# Patient Record
Sex: Male | Born: 1960 | Race: White | Hispanic: No | Marital: Married
Health system: Midwestern US, Community
[De-identification: ages and names within clinical notes are randomized; demographics above are authoritative.]

## PROBLEM LIST (undated history)

## (undated) DIAGNOSIS — IMO0002 Reserved for concepts with insufficient information to code with codable children: Secondary | ICD-10-CM

## (undated) DIAGNOSIS — E669 Obesity, unspecified: Secondary | ICD-10-CM

## (undated) DIAGNOSIS — Z0000 Encounter for general adult medical examination without abnormal findings: Secondary | ICD-10-CM

## (undated) DIAGNOSIS — M25511 Pain in right shoulder: Secondary | ICD-10-CM

## (undated) DIAGNOSIS — Z125 Encounter for screening for malignant neoplasm of prostate: Secondary | ICD-10-CM

## (undated) DIAGNOSIS — G5601 Carpal tunnel syndrome, right upper limb: Secondary | ICD-10-CM

## (undated) DIAGNOSIS — E109 Type 1 diabetes mellitus without complications: Principal | ICD-10-CM

## (undated) DIAGNOSIS — M65312 Trigger thumb, left thumb: Secondary | ICD-10-CM

## (undated) DIAGNOSIS — IMO0001 Reserved for inherently not codable concepts without codable children: Secondary | ICD-10-CM

## (undated) DIAGNOSIS — N138 Other obstructive and reflux uropathy: Secondary | ICD-10-CM

## (undated) DIAGNOSIS — G5602 Carpal tunnel syndrome, left upper limb: Secondary | ICD-10-CM

## (undated) DIAGNOSIS — E119 Type 2 diabetes mellitus without complications: Principal | ICD-10-CM

## (undated) DIAGNOSIS — N401 Enlarged prostate with lower urinary tract symptoms: Secondary | ICD-10-CM

## (undated) DIAGNOSIS — Z794 Long term (current) use of insulin: Secondary | ICD-10-CM

## (undated) DIAGNOSIS — Z1211 Encounter for screening for malignant neoplasm of colon: Secondary | ICD-10-CM

## (undated) DIAGNOSIS — E7800 Pure hypercholesterolemia, unspecified: Secondary | ICD-10-CM

## (undated) DIAGNOSIS — N528 Other male erectile dysfunction: Secondary | ICD-10-CM

---

## 2007-12-27 LAB — CBC WITH AUTO DIFFERENTIAL
Basophils %: 0.3 % (ref 0.0–2.0)
Basophils Absolute: 0 10*3 (ref 0.0–0.2)
Eosinophils %: 2 % (ref 0.0–5.0)
Eosinophils Absolute: 0.2 10*3 (ref 0.0–0.6)
Granulocyte Absolute Count: 6.4 10*3 (ref 1.7–7.7)
Hematocrit: 45.6 % (ref 40.5–52.5)
Hemoglobin: 15.9 g/dL (ref 13.5–17.5)
Lymphocytes %: 17.7 % — ABNORMAL LOW (ref 25.0–40.0)
Lymphocytes Absolute: 1.6 10*3 (ref 1.0–5.1)
MCH: 34 pg (ref 26–34)
MCHC: 35 g/dL (ref 31–36)
MCV: 97.1 fl (ref 80–100)
MPV: 9.7 fl (ref 5.0–10.5)
Monocytes %: 7.7 % (ref 0.0–8.0)
Monocytes Absolute: 0.7 10*3 (ref 0.0–0.95)
Platelets: 190 10*3 (ref 135–450)
RBC: 4.69 10*6 (ref 4.2–5.9)
RDW: 12.6 % (ref 11.5–14.5)
Segs Relative: 72.3 % — ABNORMAL HIGH (ref 42.0–63.0)
WBC: 8.9 10*3 (ref 4.0–11.0)

## 2007-12-27 LAB — BASIC METABOLIC PANEL
BUN: 16 mg/dL (ref 7–18)
CO2: 28 meq/L (ref 21–32)
Calcium: 9.4 mg/dL (ref 8.3–10.6)
Chloride: 106 meq/L (ref 99–110)
Creatinine: 1 mg/dL (ref 0.9–1.3)
GFR Est, African/Amer: >60
GFR, Estimated: >60 (ref >60)
Glucose: 186 mg/dL — ABNORMAL HIGH (ref 70–99)
Potassium: 5 meq/L (ref 3.5–5.1)
Sodium: 142 meq/L (ref 136–145)

## 2007-12-27 LAB — HEPATIC FUNCTION PANEL
ALT: 26 U/L (ref 10–40)
AST: 34 U/L (ref 15–37)
Albumin: 4.4 g/dL (ref 3.4–5.0)
Alkaline Phosphatase: 65 U/L (ref 25–100)
Bilirubin, Direct: 0.24 mg/dL (ref 0.0–0.3)
Bilirubin, Indirect: 0.5 mg/dL (ref 0.0–1.0)
Total Bilirubin: 0.7 mg/dL (ref 0.0–1.0)
Total Protein: 6.8 g/dL (ref 6.4–8.2)

## 2007-12-27 LAB — LIPID PANEL
Cholesterol, Total: 176 mg/dl (ref <200)
HDL: 49 mg/dL (ref 40–60)
LDL Calculated: 114 mg/dl — ABNORMAL HIGH (ref <100)
Triglycerides: 70 mg/dl (ref <150)
VLDL Cholesterol Calculated: 14 mg/dl

## 2007-12-27 LAB — HEMOGLOBIN A1C
A1c: 8.2 — ABNORMAL HIGH (ref 4.0–6.0)
eAG: 188.6 mg/dl

## 2008-03-27 ENCOUNTER — Encounter

## 2008-04-23 LAB — COMPREHENSIVE METABOLIC PANEL
ALT: 28 U/L (ref 10–40)
AST: 23 U/L (ref 15–37)
Albumin/Globulin Ratio: 1.8 (ref 1.1–2.2)
Albumin: 4.4 g/dL (ref 3.4–5.0)
Alkaline Phosphatase: 66 U/L (ref 25–100)
BUN: 14 mg/dL (ref 7–18)
CO2: 28 meq/L (ref 21–32)
Calcium: 9.5 mg/dL (ref 8.3–10.6)
Chloride: 106 meq/L (ref 99–110)
Creatinine: 1 mg/dL (ref 0.9–1.3)
GFR Est, African/Amer: >60
GFR, Estimated: >60 (ref >60)
Glucose: 195 mg/dL — ABNORMAL HIGH (ref 70–99)
Potassium: 4.4 meq/L (ref 3.5–5.1)
Sodium: 140 meq/L (ref 136–145)
Total Bilirubin: 0.9 mg/dL (ref 0.0–1.0)
Total Protein: 6.8 g/dL (ref 6.4–8.2)

## 2008-04-23 LAB — LIPID PANEL
Cholesterol, Total: 190 mg/dl (ref <200)
HDL: 52 mg/dL (ref 40–60)
LDL Calculated: 123 mg/dl — ABNORMAL HIGH (ref <100)
Triglycerides: 70 mg/dl (ref <150)
VLDL Cholesterol Calculated: 14 mg/dl

## 2008-04-24 LAB — HEMOGLOBIN A1C
A1c: 8 — ABNORMAL HIGH (ref 4.0–6.0)
eAG: 182.9 mg/dl

## 2008-04-30 NOTE — Telephone Encounter (Signed)
Patient requesting medication.  Patient complains of a "head cold", no fever, cough.

## 2008-05-17 NOTE — Telephone Encounter (Signed)
Busy signal on home phone today. Last time to call was  6:05 today. Will try again tomorrow, and use work number also.

## 2008-05-19 NOTE — Telephone Encounter (Signed)
Spoke with pt today and made an appointment for next week to see Dr. Selena Batten for diabetic check up.

## 2008-05-27 NOTE — Progress Notes (Signed)
Subjective:      Patient ID: Jeremiah Guzman is a 48 y.o. male.    HPI Comments:       250.00BV DM (Diabetes Mellitus)  (primary encounter diagnosis)  Plan: Liraglutide (VICTOZA) 18 MG/3ML SOLN,         phentermine (ADIPEX-P) 37.5 MG capsule        Gained wt.  Comment: A1c was 8.0 not able to control blood sugar.            Review of Systems   Constitutional: Negative.    HENT: Negative for hearing loss, ear pain and sinus pressure.    Eyes: Negative.    Respiratory: Negative for apnea, choking and stridor.    Genitourinary: Negative for hematuria, discharge, penile swelling and penile pain.   Musculoskeletal: Negative for back pain.   Skin: Negative for color change and pallor.   Neurological: Negative for dizziness, tremors, seizures and syncope.   Hematological: Does not bruise/bleed easily.   Psychiatric/Behavioral: Negative for confusion, dysphoric mood and decreased concentration.       Objective:   Physical Exam   Constitutional: He appears well-developed and well-nourished.   HENT:   Head: Normocephalic and atraumatic.   Right Ear: External ear normal.   Left Ear: External ear normal.   Nose: Nose normal.   Mouth/Throat: Oropharynx is clear and moist.   Eyes: Conjunctivae are normal. Right eye exhibits no discharge. Left eye exhibits no discharge. No scleral icterus.   Neck: Normal range of motion. Neck supple. No JVD present. No tracheal deviation present. No thyromegaly present.   Cardiovascular: Normal rate, regular rhythm, normal heart sounds and intact distal pulses.  Exam reveals no gallop and no friction rub.    No murmur heard.  Pulmonary/Chest: Effort normal and breath sounds normal. No stridor. No respiratory distress. He has no wheezes. He has no rales. He exhibits no tenderness.   Abdominal: Soft. Bowel sounds are normal. He exhibits no distension and no mass. No tenderness. He has no rebound and no guarding.   Musculoskeletal: He exhibits no edema and no tenderness.   Lymphadenopathy:     He has no  cervical adenopathy.   Neurological: He has normal reflexes. No cranial nerve deficit. He exhibits normal muscle tone. Coordination normal.   Skin: Skin is warm and dry. No rash noted. No erythema. No pallor.   Psychiatric: He has a normal mood and affect. His behavior is normal. Judgment and thought content normal.       Assessment:      Diabetes.        Plan:      See above.

## 2008-06-28 NOTE — Telephone Encounter (Signed)
Patient walked in 06-28-2008 to be weighed and wanted a refill.   Patient weighed 245.8, he lost 5.2 pounds since 05-27-2008.

## 2008-09-23 NOTE — Progress Notes (Addendum)
Addended by: Emeril Stille on: 09/23/2008      Modules accepted: Orders

## 2008-09-23 NOTE — Progress Notes (Signed)
Subjective:      Patient ID: Jeremiah Guzman is a 48 y.o. male.    HPI Comments: Jeremiah Guzman is a 48 y.o. male who presents with vomited blood yesterday. Had symptoms of abdominal pain and vomiting. More than 6 to 12 oz of blood, fresh vomiting.. Symptom onset has been acute for a time period of 1 day(s). Severity is described as moderate to severe. Course of his symptoms over time is acute. Never had any Sx of GI system. No blood in stool..        Review of Systems   Constitutional: Negative.    HENT: Negative.  Negative for hearing loss, ear pain and sinus pressure.    Eyes: Negative.    Respiratory: Negative for apnea, choking and stridor.    Gastrointestinal: Negative.    Genitourinary: Negative for hematuria.   Musculoskeletal: Negative.  Negative for back pain.   Skin: Negative.  Negative for color change and pallor.   Neurological: Negative for dizziness, tremors, seizures and syncope.   Hematological: Does not bruise/bleed easily.   Psychiatric/Behavioral: Negative for confusion, dysphoric mood and decreased concentration.       Objective:   Physical Exam   Constitutional: He is oriented to person, place, and time. He appears well-nourished.   HENT:   Head: Normocephalic.   Neck: Neck supple.   Pulmonary/Chest: Effort normal.   Abdominal: Soft.   Musculoskeletal: He exhibits no edema.   Neurological: He is alert and oriented to person, place, and time.   Skin: Skin is warm.       Assessment:      Encounter Diagnoses   Name Primary?   ??? Obese    ??? DM (Diabetes Mellitus)    ??? Hematemesis Yes             Plan:      Referral to GI and blood test foe DM. Refill his adipex. byetta or victoza were not approved by insurance co.        Dry cough due to lisinopril. Had colonoscope in 3 years ago.

## 2008-09-24 LAB — HEMOGLOBIN A1C
A1c: 8.4 — ABNORMAL HIGH (ref 4.0–6.0)
eAG: 194.4 mg/dl

## 2008-09-24 LAB — COMPREHENSIVE METABOLIC PANEL
ALT: 30 U/L (ref 10–40)
AST: 23 U/L (ref 15–37)
Albumin/Globulin Ratio: 2 (ref 1.1–2.2)
Albumin: 4.7 g/dL (ref 3.4–5.0)
Alkaline Phosphatase: 61 U/L
BUN: 12 mg/dL (ref 7–18)
CO2: 26 meq/L (ref 21–32)
Calcium: 9.9 mg/dL (ref 8.3–10.6)
Chloride: 102 meq/L (ref 99–110)
Creatinine: 1 mg/dL (ref 0.9–1.3)
GFR Est, African/Amer: >60
GFR, Estimated: >60 (ref >60)
Glucose: 66 mg/dL — ABNORMAL LOW (ref 70–99)
Potassium: 3.9 meq/L (ref 3.5–5.1)
Sodium: 144 meq/L (ref 136–145)
Total Bilirubin: 0.5 mg/dL (ref 0.0–1.0)
Total Protein: 7 g/dL (ref 6.4–8.2)

## 2008-09-24 LAB — LIPID PANEL
Cholesterol, Total: 188 mg/dl (ref <200)
HDL: 61 mg/dL — ABNORMAL HIGH (ref 40–60)
LDL Calculated: 112 mg/dl — ABNORMAL HIGH (ref <100)
Triglycerides: 78 mg/dl (ref <150)
VLDL Cholesterol Calculated: 16 mg/dl

## 2009-02-03 LAB — POCT URINALYSIS DIPSTICK
Bilirubin, UA: NEGATIVE
Blood, UA POC: NEGATIVE
Glucose, UA POC: NEGATIVE
Ketones, UA: NEGATIVE
Nitrite, UA: NEGATIVE
Protein, UA POC: NEGATIVE
Spec Grav, UA: 1.02
Urobilinogen, UA: NEGATIVE
pH, UA: 5

## 2009-02-03 NOTE — Progress Notes (Signed)
Subjective:      Patient ID: Jeremiah Guzman is a 49 y.o. male.    HPI Comments: Jeremiah Guzman is a 49 y.o. 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)      Obesity (278.62M)         Date Noted: 03/27/2008      DM (Diabetes Mellitus) (250.00BV)         Date Noted: 03/27/2008      HYPERLIPIDEMIA (272.4S)         Date Noted: 03/27/2008      HBP (HIGH BLOOD PRESSURE) (401.9AP)         Date Noted: 03/27/2008      Current outpatient prescriptions:  simvastatin (ZOCOR) 40 MG tablet, Take 1 tablet by mouth daily., Disp: 30 tablet, Rfl: 6  Insulin Pen Needle (B-D ULTRAFINE III SHORT PEN) 31G X 8 MM MISC, by Does not apply route 2 times daily., Disp: 100 each, Rfl: 12  phentermine (ADIPEX-P) 37.5 MG capsule, Take 1 capsule by mouth every morning., Disp: 30 capsule, Rfl: 0  insulin glargine (LANTUS SOLOSTAR) 100 UNIT/ML injection, INJECT 60 UNITS DAILY, Disp: 15 mL, Rfl: 6  metformin (GLUCOPHAGE) 500 MG tablet, Take 500 mg by mouth 2 times daily (with meals)., Disp: , Rfl:   lisinopril (PRINIVIL;ZESTRIL) 10 MG tablet, Take 1 tablet by mouth daily., Disp: 30 tablet, Rfl: 6      Allergies: Review of patient's allergies indicates no known allergies.  Past Medical History:    OBESITY                                         03/27/2008     HYPERLIPIDEMIA                                  03/27/2008     HBP (HIGH BLOOD PRESSURE)                       03/27/2008     DM W/O COMPLICATION TYPE II                                   CANCER OF SKIN                                              Past Surgical History:    TONSILLECTOMY                                               Review of patient's family history indicates:    Cancer                         Mother                      Comment: colon    Cancer  Father                      Comment: skin      Tobacco Use: Never           Alcohol Use: Yes           4.0 oz/week       8 Drinks containing 0.5 oz of  alcohol per week        Diabetes  He presents for his follow-up diabetic visit. He has type 2 diabetes mellitus. The initial diagnosis of diabetes was made 12 years ago. His disease course has been fluctuating. Pertinent negatives for diabetes include no blurred vision, no chest pain, no foot paresthesias, no foot ulcerations, no polyphagia and no weight loss. Symptoms are stable. There are no hypoglycemic associated symptoms. Pertinent negatives for hypoglycemia include no confusion, dizziness, headaches, pallor, seizures or tremors. There are no diabetic complications. Risk factors for coronary artery disease include diabetes mellitus, dyslipidemia, hypertension and male sex.       Review of Systems   Constitutional: Negative for weight loss.   HENT: Negative.  Negative for hearing loss, ear pain and sinus pressure.    Eyes: Negative.  Negative for blurred vision and visual disturbance.   Respiratory: Negative.  Negative for apnea, choking and stridor.    Cardiovascular: Negative.  Negative for chest pain and palpitations.   Gastrointestinal: Negative.  Negative for diarrhea.   Genitourinary: Negative.  Negative for hematuria, penile swelling and penile pain.   Musculoskeletal: Negative.  Negative for back pain.   Skin: Negative.  Negative for color change and pallor.   Neurological: Negative for dizziness, tremors, seizures, syncope, numbness and headaches.   Hematological: Negative for polyphagia. Does not bruise/bleed easily.   Psychiatric/Behavioral: Negative for confusion, dysphoric mood and decreased concentration.       Objective:   Physical Exam   Constitutional: He appears well-developed and well-nourished.   HENT:   Mouth/Throat: No oropharyngeal exudate.   Eyes: Conjunctivae and extraocular motions are normal. Pupils are equal, round, and reactive to light. No scleral icterus.   Neck: Normal range of motion. No thyromegaly present.   Cardiovascular: Normal rate, regular rhythm and intact distal pulses.     Pulmonary/Chest: Effort normal. He has no wheezes. He exhibits no tenderness.   Abdominal: Soft. He exhibits no mass. No tenderness.   Musculoskeletal: Normal range of motion. He exhibits no edema.   Lymphadenopathy:     He has no cervical adenopathy.   Neurological: He is alert. No cranial nerve deficit.   Skin: Skin is warm.   Psychiatric: His behavior is normal.       Assessment:      Encounter Diagnoses   Name Primary?   . DM (diabetes mellitus) Yes   . Obesity              Plan:      See above.

## 2009-02-03 NOTE — Progress Notes (Signed)
Assessed patient's tobacco status?  Yes   If Smoker - Cessation materials given? No    Microfilament or Tuning Fork placed on counter? Yes    Shoes and socks removed? Yes    Diabetic retinal exam done? Yes   If yes, where? Dr Jacolyn Reedy             When? November / 2010    In chart? No    Medication list reviewed Yes   Is patient on Aspirin? Yes    BP taken with correct size cuff? Yes   Repeated if > 130/80 No     Microalbumin performed if applicable?  Yes , patient was not able to give a specimen.

## 2009-02-04 LAB — COMPREHENSIVE METABOLIC PANEL
ALT: 29 U/L (ref 10–40)
AST: 21 U/L (ref 15–37)
Albumin/Globulin Ratio: 2 (ref 1.1–2.2)
Albumin: 4.7 g/dL (ref 3.4–5.0)
Alkaline Phosphatase: 70 U/L (ref 45–129)
BUN: 13 mg/dL (ref 7–18)
CO2: 29 meq/L (ref 21–32)
Calcium: 9.7 mg/dL (ref 8.3–10.6)
Chloride: 102 meq/L (ref 99–110)
Creatinine: 1 mg/dL (ref 0.9–1.3)
GFR Est, African/Amer: >60
GFR, Estimated: >60 (ref >60)
Glucose: 135 mg/dL — ABNORMAL HIGH (ref 70–99)
Potassium: 3.9 meq/L (ref 3.5–5.1)
Sodium: 138 meq/L (ref 136–145)
Total Bilirubin: 0.8 mg/dL (ref 0.0–1.0)
Total Protein: 7.1 g/dL (ref 6.4–8.2)

## 2009-02-04 LAB — LIPID PANEL
Cholesterol, Total: 200 mg/dl — ABNORMAL HIGH (ref <200)
HDL: 53 mg/dL (ref 40–60)
LDL Calculated: 128 mg/dl — ABNORMAL HIGH (ref <100)
Triglycerides: 92 mg/dl (ref <150)
VLDL Cholesterol Calculated: 18 mg/dl

## 2009-02-04 LAB — CBC
Hematocrit: 45.5 % (ref 40.5–52.5)
Hemoglobin: 15.7 g/dL (ref 13.5–17.5)
MCH: 33.2 pg (ref 26–34)
MCHC: 34.4 g/dL (ref 31–36)
MCV: 96.3 fl (ref 80–100)
MPV: 10.9 fl — ABNORMAL HIGH (ref 5.0–10.5)
Platelets: 199 10*3 (ref 135–450)
RBC: 4.72 10*6 (ref 4.2–5.9)
RDW: 11.9 % (ref 11.5–14.5)
WBC: 10.4 10*3 (ref 4.0–11.0)

## 2009-02-04 LAB — HEMOGLOBIN A1C
A1c: 8.8 — ABNORMAL HIGH (ref 4.0–6.0)
eAG: 205.9 mg/dl

## 2009-03-14 NOTE — Progress Notes (Signed)
Patient requesting a refill on Adipex. Last weight was 250 on 02-03-2009.Current weight is 244.4.  Rosanne Ashing last filled on 02-03-2009 and the time before that was in September.

## 2009-07-29 LAB — COMPREHENSIVE METABOLIC PANEL
ALT: 45 U/L — ABNORMAL HIGH (ref 10–40)
AST: 25 U/L (ref 15–37)
Albumin/Globulin Ratio: 1.7 (ref 1.1–2.2)
Albumin: 4.4 gm/dl (ref 3.4–5.0)
Alkaline Phosphatase: 68 U/L (ref 45–129)
BUN: 15 mg/dl (ref 7–18)
CO2: 25 mEq/L (ref 21–32)
Calcium: 9.6 mg/dl (ref 8.3–10.6)
Chloride: 103 mEq/L (ref 99–110)
Creatinine: 1 mg/dl (ref 0.9–1.3)
GFR Est, African/Amer: >60
GFR, Estimated: >60 (ref >60)
Glucose: 219 mg/dl — ABNORMAL HIGH (ref 70–99)
Potassium: 5.1 mEq/L (ref 3.5–5.1)
Sodium: 140 mEq/L (ref 136–145)
Total Bilirubin: 0.7 mg/dl (ref 0.0–1.0)
Total Protein: 7 gm/dl (ref 6.4–8.2)

## 2009-07-29 LAB — LIPID PANEL
Cholesterol, Total: 189 mg/dl (ref <200)
HDL: 62 mg/dl — ABNORMAL HIGH (ref 40–60)
LDL Calculated: 108 mg/dl — ABNORMAL HIGH (ref <100)
Triglycerides: 100 mg/dl (ref <150)
VLDL Cholesterol Calculated: 20 mg/dl

## 2009-07-29 LAB — CBC
Hematocrit: 44.2 % (ref 40.5–52.5)
Hemoglobin: 15.2 gm/dl (ref 13.5–17.5)
MCH: 33.6 pg (ref 26–34)
MCHC: 34.5 gm/dl (ref 31–36)
MCV: 97.4 fl (ref 80–100)
MPV: 10.2 fl (ref 5.0–10.5)
Platelets: 182 10*3 (ref 135–450)
RBC: 4.54 10*6 (ref 4.2–5.9)
RDW: 12.3 % (ref 11.5–14.5)
WBC: 9.5 10*3 (ref 4.0–11.0)

## 2009-07-29 NOTE — Progress Notes (Signed)
Subjective:      Patient ID: Jeremiah Guzman is a 49 y.o. male.    Chief Complaint   Patient presents with   ??? Neck Pain     Patient states that he started Thursday morning with discomfort/ pain on the Left side of his Neck. Patient states that he has no problems swallowing.     Nursing note/chief complaint reviewed and discussed with patient.    Vital signs, Allergies. Past Medical, Social  and Surgical Histories as well as Family History reviewed and discussed with patient.    Neck Pain   This is a new problem. The current episode started yesterday (Had congestion and runny nose as well as maxilllary sinus pressure earlier in the week. Had just returned from 1 week vacation in [place].). The problem occurs constantly. The problem has been unchanged. The pain is associated with nothing. The pain is present in the left side and anterior neck. The quality of the pain is described as aching. The pain is at a severity of 8/10. The pain is severe. The symptoms are aggravated by swallowing. The pain is same all the time. Associated symptoms include a fever (felt feverish earlier in the week), headaches and pain with swallowing. Pertinent negatives include no chest pain, leg pain, numbness, paresis, tingling, trouble swallowing, weakness or weight loss. He has tried NSAIDs for the symptoms. The treatment provided no relief.       Review of Systems   Constitutional: Positive for fever (felt feverish earlier in the week). Negative for weight loss, diaphoresis and appetite change.   HENT: Positive for congestion (earlier in the week), rhinorrhea (earlier in the week), neck pain and sinus pressure (earlier in the week). Negative for hearing loss, ear pain, nosebleeds, sore throat, trouble swallowing, neck stiffness, voice change, tinnitus and ear discharge.    Eyes: Negative.    Respiratory: Negative for cough, chest tightness, shortness of breath and wheezing.    Cardiovascular: Negative for chest pain, palpitations and leg  swelling.   Gastrointestinal: Negative for nausea, vomiting and abdominal pain.   Skin: Negative for rash.   Neurological: Positive for headaches. Negative for dizziness, tingling, weakness, light-headedness and numbness.   Hematological: Negative for adenopathy (unsure).   Psychiatric/Behavioral: Negative.        Objective:   Physical Exam   [nursing notereviewed.  Constitutional: He is oriented to person, place, and time. Vital signs are normal. He appears well-developed and well-nourished. He appears cachectic. He is active. He does not appear ill. He appears distressed (mildly).   HENT:   Head: Normocephalic and atraumatic.   Right Ear: Tympanic membrane is not injected and not erythematous. A middle ear effusion is present.   Left Ear: Tympanic membrane is not injected and not erythematous. A middle ear effusion (L>R) is present.   Nose: Mucosal edema present. No rhinorrhea. Right sinus exhibits no maxillary sinus tenderness and no frontal sinus tenderness. Left sinus exhibits no maxillary sinus tenderness and no frontal sinus tenderness.   Mouth/Throat: Uvula is midline and mucous membranes are normal. Normal dentition. No uvula swelling. Oropharyngeal exudate (PND) present. No posterior oropharyngeal edema or posterior oropharyngeal erythema.   Eyes: Conjunctivae and EOM are normal. Pupils are equal, round, and reactive to light. Right eye exhibits no discharge. Left eye exhibits no discharge.   Neck: Trachea normal, normal range of motion, full passive range of motion without pain and phonation normal. Neck supple. Normal carotid pulses, no hepatojugular reflux and no JVD present. Muscular tenderness (  just behind swollen left anterior cervical lymph gland) present. No tracheal tenderness and no spinous process tenderness present. Carotid bruit is not present. No rigidity. No tracheal deviation, no edema, no erythema and normal range of motion present. No mass and no thyromegaly present.   Cardiovascular:  Normal rate, regular rhythm, normal heart sounds and intact distal pulses.    No murmur heard.  Pulmonary/Chest: Effort normal and breath sounds normal. No stridor. No respiratory distress.   Lymphadenopathy:     He has cervical adenopathy.        Right cervical: No superficial cervical adenopathy present.       Left cervical: Superficial cervical and deep cervical adenopathy present.   Neurological: He is alert and oriented to person, place, and time.   Skin: Skin is warm and dry. No rash noted.   Psychiatric: He has a normal mood and affect.       Assessment:      1. Cervical lymphadenitis  CBC, predniSONE (DELTASONE) 10 MG tablet    left-sided only     2. Anterior neck pain  hydrocodone-acetaminophen (CO-GESIC) 5-500 MG per tablet   3. Obesity  LIPID PANEL, HEMOGLOBIN A1C, COMPREHENSIVE METABOLIC PANEL, CBC, phentermine (ADIPEX-P) 37.5 MG capsule   4. ETD (eustachian tube dysfunction)  mometasone (NASONEX) 50 MCG/ACT nasal spray             Plan:      Prednisone as directed. No NSAID's until prednisone completed  Nasonex as needed for ear/nasal congestion (Rx given)  Vicodin as needed for severe breakthrough pain  Start Adipex for appetite control- suggested starting [email or web address] (explained at length how the website works)  F/U in 1 month for weight loss check

## 2009-07-29 NOTE — Progress Notes (Deleted)
Subjective:      Patient ID: Jeremiah Guzman is a 49 y.o. male.    HPI    Review of Systems    Objective:   Physical Exam    Assessment:      ***      Plan:      ***

## 2009-07-30 LAB — HEMOGLOBIN A1C
A1c: 8.4 — ABNORMAL HIGH (ref 4.0–6.0)
eAG: 194.4 mg/dl

## 2009-08-01 NOTE — Patient Instructions (Addendum)
Prednisone as directed. No NSAID's until prednisone completed  Nasonex as needed for ear/nasal congestion (Rx given)  Vicodin as needed for severe breakthrough pain  Start Adipex for appetite control- suggested starting [email or web address]  F/U in 1 month for weight loss check    Chronic Lymphadenitis   (Lymph Node Infection; Lymph Gland Infection; Lymph Node Inflammation; Lymph Gland Inflammation)     Definition   Chronic lymphadenitis is the inflammation of a lymph node. The inflammation can last for a prolonged period of time. Lymph nodes are part of the immune system. This system fights and prevents infections. The lymph node's job is to filter out unwanted substances, such as bacteria and viruses, and help eliminate them from the body.   Lymph nodes occur in clusters in the neck, arm pits, and groin. Chronic lymphadenitis may affect one node, several nodes in one area (regional), or nodes in many areas of the body (general).   The sooner chronic lymphadenitis is treated, the more favorable the outcome. If you suspect you have this condition, contact your doctor immediately.     Swollen Lymph Nodes        2011 [employer]   Causes   Lymph nodes normally swell when fighting off an infection. In cases of more serious infection, the swelling may be prolonged. Lymphadenitis is usually caused by an infection that has spread to the lymph nodes from a skin, ear, nose, or eye infection. Other causes of lymphadenitis include the following:   ?? Infection with streptococcal or staphylococcal bacteria   ?? Bacterial sore throat   ?? Tonsillitis   ?? Dental infection   ?? HIV infection   ?? Tuberculosis   ?? Nontuberculosis mycobacterial disease   ?? Tularemia (rabbit fever)   ?? Plague   ?? Cat scratch disease   ?? Primary or secondary syphilis   ?? Lymphogranuloma venereum   ?? Chancroid   ?? Genital herpes   ?? Mononucleosis   ?? Cytomegalovirus infection   ?? Toxoplasmosis   ?? Brucellosis   ?? Disseminated histoplasmosis     Lymph node inflammation may also be caused by circulating cancer cells.   Risk Factors   A risk factor is something that increases your chances of getting a disease or condition.   The following factors increase your chances of developing chronic lymphadenitis. If you have any of these risk factors, tell your doctor:   ?? The conditions listed under Causes   ?? Close contact with someone who has one of the conditions listed above   ?? Age: 20 or younger; chronic lymphadenitis commonly occurs in children.   ?? Contact with animals, specifically cats, rats, or cows   Symptoms   If you experience any of these symptoms, do not assume it is due to chronic lymphadenitis. These symptoms may be caused by other, less serious health conditions. If you experience any one of them, see your physician.   Symptoms include:   ?? Swollen, painful, tender, or hard lymph nodes   ?? The skin over a node is red and warm to the touch   ?? Fever with the following symptoms:   ?? Chills   ?? Loss of appetite   ?? Heavy perspiration   ?? Rapid pulse   ?? General weakness   ?? Difficulty swallowing   ?? Difficulty breathing   ?? Neck stiffness   Diagnosis   Your doctor will ask about your symptoms and medical history, and perform a physical exam.  Tests may include the following:   ?? White blood cell (WBC) countWBCs help fight infection, so levels will be high if you have an infection.   ?? Blood culturetesting of a sample of blood to look for bacteria or fungus   ?? Biopsy of the lymph noderemoval of a sample of lymph node tissue for testing   ?? Ultrasounda test that uses sound waves to examine your lymph nodes   Treatment   Treatment of chronic lymphadenitis depends on the cause. Talk with your doctor about the best treatment plan for you. Treatment options include the following:   Medications   ?? Antibiotics to control an infection   ?? Anti-inflammatory medicationsto help reduce inflammation and swelling; aspirin may be recommended for adults.   ?? Note  : Aspirin is not recommended for children or teens with a current or recent viral infection. This is because of the risk of Reye's syndrome . Ask your doctor which other medicines are safe for your child.   Supportive Care   Hot, moist compresses on the lymph nodes can help relieve pain.   Surgery   Surgery may be necessary to drain abscesses (pockets of pus), if they occur.   Prevention   To help reduce your chances of getting chronic lymphadenitis, take the following steps:   ?? Seek prompt treatment of bacterial and viral infections. Contact your doctor at the first signs of infection (fever, chills, redness).   ?? Take steps to prevent getting an infection:   ?? Practice good hygiene practices, such as washing your hands regularly.   ?? Avoid close contact with people who are sick.   ?? Eat a healthful diet, one that is low in saturated fat and rich in whole grains, fruits, and vegetables.     Last Reviewed: September 2010 B. Antoine Primas, MD   Updated: 11/11/2008

## 2009-08-19 NOTE — Telephone Encounter (Signed)
Jeremiah Guzman canceled his appointment today.   He is requesting a refill on Accuchek compact plus test strips. He test BID.  He is also asking for a new meter.  [facility].

## 2009-08-25 NOTE — Progress Notes (Signed)
Assessed patient's tobacco status?  No   If Smoker - Cessation materials given? No    Microfilament or Tuning Fork placed on counter? Yes    Shoes and socks removed? Yes    Diabetic retinal exam done? Yes   If yes, where? Dr Jacolyn Reedy          When? October / 2010    In chart? Yes    Medication list reviewed Yes   Is patient on Aspirin? No    BP taken with correct size cuff? Yes   Repeated if > 130/80 No     Microalbumin performed if applicable?  Yes, will obtain today

## 2009-08-25 NOTE — Progress Notes (Signed)
Subjective:      Patient ID: Jeremiah Guzman is a 49 y.o. male.    Diabetes  He presents for his follow-up diabetic visit. He has type 2 diabetes mellitus. The initial diagnosis of diabetes was made 12 years ago. His disease course has been stable. Hypoglycemia symptoms include headaches and nervousness/anxiousness. Pertinent negatives for hypoglycemia include no confusion, dizziness, pallor, seizures or tremors. Pertinent negatives for diabetes include no blurred vision. There are no diabetic complications. Risk factors for coronary artery disease include diabetes mellitus and hypertension. Current diabetic treatment includes insulin injections. He is compliant with treatment all of the time. He is following a low fat/cholesterol and generally healthy diet. He participates in exercise intermittently. His breakfast blood glucose is taken between 7-8 am. His breakfast blood glucose range is generally 140-180 mg/dl. An ACE inhibitor/angiotensin II receptor blocker is being taken.       Review of Systems   Constitutional: Negative.    HENT: Negative for hearing loss, ear pain and sinus pressure.    Eyes: Negative.  Negative for blurred vision.   Respiratory: Negative.  Negative for apnea, choking and stridor.    Cardiovascular: Negative.    Gastrointestinal: Negative.    Genitourinary: Negative.  Negative for hematuria and penile swelling.   Musculoskeletal: Negative.  Negative for back pain.   Skin: Negative for color change and pallor.   Neurological: Positive for headaches. Negative for dizziness, tremors, seizures and syncope.   Hematological: Does not bruise/bleed easily.   Psychiatric/Behavioral: Positive for sleep disturbance. Negative for confusion, dysphoric mood and decreased concentration. The patient is nervous/anxious.        Objective:   Physical Exam   Constitutional: He appears well-developed and well-nourished.   HENT:   Mouth/Throat: No oropharyngeal exudate.   Eyes: Conjunctivae and EOM are normal. Pupils  are equal, round, and reactive to light. No scleral icterus.   Neck: Normal range of motion. No thyromegaly present.   Cardiovascular: Normal rate, regular rhythm and intact distal pulses.    Pulmonary/Chest: Effort normal. He has no wheezes. He exhibits no tenderness.   Abdominal: Soft. He exhibits no mass. No tenderness.   Musculoskeletal: Normal range of motion. He exhibits no edema.   Lymphadenopathy:     He has no cervical adenopathy.   Neurological: He is alert. No cranial nerve deficit.   Skin: Skin is warm.   Psychiatric: His behavior is normal.       Assessment:      Encounter Diagnoses   Name Primary?   ??? Obesity    ??? DM (diabetes mellitus) Yes     s      Plan:      I have recommended the following steps for improving diabetic care and outcome to him: home glucose monitoring emphasized, use and side effects of insulin is taught and annual eye examinations at Ophthalmology discussed. A followup visit will be scheduled in the near future to review and reinforce the importance of careful diabetic control to improve long term outcomes.          Encounter Diagnoses   Name Primary?   ??? Obesity    ??? DM (diabetes mellitus) Yes

## 2009-09-01 NOTE — Telephone Encounter (Signed)
[facility] states that Janumet 50-1000 #60 BID needs PA with insurance.  [employer] [phone]  ID [id number]  [facility] [phone].  Do PA?

## 2009-09-01 NOTE — Telephone Encounter (Signed)
Try with PA.

## 2009-09-01 NOTE — Telephone Encounter (Signed)
Called [employer] [facility] changed medication to 50-500 #60 BID.

## 2009-09-01 NOTE — Telephone Encounter (Signed)
He needs janumet 50/500 bid. He is on insuline already.

## 2009-09-01 NOTE — Telephone Encounter (Signed)
It shows patient taking Janumet 50-500 #60 BID in the chart.   Please advise which one patient is to be on.    Janumet 50-500 #60 BID was sent to [facility] on 08-25-2009.  Janumet 50-1000 #60 BID was discontinued 08-25-2009.

## 2009-12-05 NOTE — Telephone Encounter (Signed)
Jeremiah Guzman is requesting a refill on Lantus sent to [facility].

## 2010-01-27 MED ORDER — PHENTERMINE HCL 37.5 MG PO CAPS
37.5 MG | ORAL_CAPSULE | Freq: Every morning | ORAL | Status: DC
Start: 2010-01-27 — End: 2010-02-24

## 2010-01-27 NOTE — Progress Notes (Signed)
Subjective:      Patient ID: Jeremiah Guzman is a 50 y.o. male.    HPI Comments: Jeremiah Guzman is a 50 y.o. male who presents with rt side facial numbness and tingling after had cold Sx 10 days ago.. Symptom onset has been acute for a time period of 10 day(s). Severity is described as moderate. Course of his symptoms over time is acute. No sign of weakness of facial muscles..        Review of Systems   Constitutional: Negative.    HENT: Negative for hearing loss, ear pain and sinus pressure.    Eyes: Negative.    Respiratory: Negative for apnea, choking and stridor.    Genitourinary: Negative for hematuria, discharge, penile swelling and penile pain.   Musculoskeletal: Negative for back pain.   Skin: Negative for color change and pallor.   Neurological: Positive for numbness. Negative for dizziness, tremors, seizures and syncope.        [Rt side temporal area of face is numb and tingling.  Hematological: Does not bruise/bleed easily.   Psychiatric/Behavioral: Negative for confusion, dysphoric mood and decreased concentration.       Objective:   Physical Exam   Constitutional: He appears well-developed and well-nourished.   HENT:   Mouth/Throat: No oropharyngeal exudate.   Eyes: Conjunctivae and EOM are normal. Pupils are equal, round, and reactive to light. No scleral icterus.   Neck: Normal range of motion. No thyromegaly present.   Cardiovascular: Normal rate, regular rhythm and intact distal pulses.    Pulmonary/Chest: Effort normal. He has no wheezes. He exhibits no tenderness.   Abdominal: Soft. He exhibits no mass. No tenderness.   Musculoskeletal: Normal range of motion. He exhibits no edema.   Lymphadenopathy:     He has no cervical adenopathy.   Neurological: He is alert. No cranial nerve deficit.   Skin: Skin is warm.        Facial skin is clear but feels numb, comes and goes. Intermitently. Nothing irritated.    Psychiatric: His behavior is normal.       Assessment:      Encounter Diagnoses   Name Primary?   ???  Trigeminal neuralgia Yes   ??? DM (diabetes mellitus)    ??? Obesity            Plan:      Observation for neuralgia. Refill med for overweight.

## 2010-01-27 NOTE — Patient Instructions (Signed)
Watch diet and exercise for weight and diabetes.

## 2010-01-30 NOTE — Telephone Encounter (Signed)
[facility] states JANUMET 50/500 #60 BID needs a PA.  [phone].

## 2010-02-02 NOTE — Telephone Encounter (Signed)
[facility], they are faxing over a form.

## 2010-02-08 NOTE — Telephone Encounter (Signed)
Faxed form to [facility].

## 2010-02-09 NOTE — Telephone Encounter (Signed)
Janumet has been approved.

## 2010-02-21 MED ORDER — LISINOPRIL 10 MG PO TABS
10 MG | ORAL_TABLET | Freq: Every day | ORAL | Status: DC
Start: 2010-02-21 — End: 2010-02-21

## 2010-02-21 MED ORDER — SIMVASTATIN 40 MG PO TABS
40 MG | ORAL_TABLET | Freq: Every day | ORAL | Status: DC
Start: 2010-02-21 — End: 2011-03-23

## 2010-02-21 MED ORDER — LISINOPRIL 10 MG PO TABS
10 MG | ORAL_TABLET | Freq: Every day | ORAL | Status: DC
Start: 2010-02-21 — End: 2011-03-23

## 2010-02-21 MED ORDER — SIMVASTATIN 40 MG PO TABS
40 MG | ORAL_TABLET | Freq: Every day | ORAL | Status: DC
Start: 2010-02-21 — End: 2010-02-21

## 2010-02-21 NOTE — Telephone Encounter (Signed)
Jeremiah Guzman is requesting a refill on Lisinopril and Simvastatin sent to [facility].

## 2010-02-21 NOTE — Telephone Encounter (Signed)
Jeremiah Guzman states he will be out of his Lisinopril and Simvastatin and doesn't think [facility] will be here in time.   He is requesting for a refill sent to [facility].

## 2010-02-24 MED ORDER — METHYLPREDNISOLONE 4 MG PO TBPK
4 MG | PACK | ORAL | Status: AC
Start: 2010-02-24 — End: 2010-03-02

## 2010-02-24 MED ORDER — PHENTERMINE HCL 37.5 MG PO CAPS
37.5 MG | ORAL_CAPSULE | Freq: Every morning | ORAL | Status: DC
Start: 2010-02-24 — End: 2010-07-17

## 2010-02-24 NOTE — Progress Notes (Signed)
Subjective:      Patient ID: Jeremiah Guzman is a 50 y.o. male.    Chief Complaint   Patient presents with   ??? Facial Pain     and swelling, has had numbness in the past, right temple, ear and  jaw pain x over a year      Nursing note/chief complaint reviewed and discussed with patient. OV notes from 07-29-09 and 01-27-10 reviewed and discussed with pt.    Vital signs, Allergies. Past Medical, Social  and Surgical Histories as well as Family History reviewed and discussed with patient.    BP 138/78   Pulse 76   Temp(Src) 98.3 ??F (36.8 ??C) (Oral)   Resp 18   Ht 5\' 11"  (1.803 m)   Wt 254 lb (115.214 kg)   BMI 35.43 kg/m2    Wt Readings from Last 3 Encounters:   02/24/10 254 lb (115.214 kg)   01/27/10 258 lb 6.4 oz (117.209 kg)   08/25/09 247 lb 12.8 oz (112.401 kg)         HPI  Pt comes in for recurrent right sided facial neve pain and swelling. States that in the morning he cannot close his mouth due to the pain (and I think due to the swelling). Is worried that he may have cancer due to chewing tobacco which he quit a month ago. Also reports chewing a lot of gum instead after quitting the chewing tobacco.    States that he had stopped taking the Adipex for awhile, but went back on it last month and has lost 4 pounds.  Wants last Adipex prescription before 6 month hiatus.    Review of Systems   Constitutional: Negative for fever.   HENT: Positive for facial swelling (bilateral jaw (R>L)). Negative for ear pain (right ear feels plugged- tried Nasonex with no difference noted).         [Right jaw pain          Objective:   Physical Exam   Nursing note and vitals reviewed.  Constitutional: He is oriented to person, place, and time. He appears well-developed and well-nourished. He appears distressed.        Body mass index is 35.43 kg/(m^2).     HENT:   Head:       Right Ear: Tympanic membrane is not injected. A middle ear effusion is present.   Left Ear: Tympanic membrane is not injected. A middle ear effusion is present.      Neurological: He is alert and oriented to person, place, and time.   Skin: Skin is warm and dry. He is not diaphoretic.   Psychiatric: He has a normal mood and affect.       Assessment:      1. 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 Jaw pain  Ambulatory referral to Dentistry   3. Obesity  phentermine (ADIPEX-P) 37.5 MG capsule             Plan:      Ice for the swelling (Advised to quit chewing gum)  Steroids (Medrol) as directed (Rx given) - no NSAID's like Ibuprofen/Naprosyn until complete  Referral to Otolaryngologist- call [employer] to see who they use or consult the local yellow pages to see if they take your insurance    Adipex refilled for last time before 6 month hiatus from the medication (next refill in August 2012).    > 25 min spent with  patient- >50 % continuity of care and/or counselling.

## 2010-02-24 NOTE — Patient Instructions (Addendum)
Ice for the swelling  Steroids (Medrol) as directed (Rx given) - no NSAID's like Ibuprofen/Naprosyn until complete  Referral to Otolaryngologist- call [employer] to see who they use or consult the local yellow pages to see if they take your insurance    Temporomandibular Disorder   (TMD; Temporomandibular Joint Disorder; Temporomandibular Joint Dysfunction; TMJ Syndrome; TMJ Osteoarthritis)     Definition   Temporomandibular disorder (TMD) is a painful condition in the joint that opens and closes the mouth. These temporomandibular joints are the small joints in front of each ear. They attach the lower jaw (mandible) to the skull. The disorder may affect the jaw joint or the muscles surrounding it.   The Temporomandibular Joint        2011 [employer]   Causes   The exact cause of TMD syndrome is often unclear. Possible causes include:   ?? Excess tension in the jaw muscles   ?? Faulty alignment between the upper and lower teeth   ?? Disturbed movement of the jaw joint   ?? Displacement or abnormal position of the jaw joint or cartilage disc inside the jaw joint   ?? Arthritis or similar inflammatory process in the joint   ?? Excess or limited motion of the joint   ?? Injury of the jaw or face   Risk Factors   Factors that increase your chance of TMD include:   ?? Sex: male   ?? Age: 46-50 years old   ?? Clenching or grinding of teeth   ?? Poorly fitting dentures or crowns   ?? Fibromyalgia   ?? Stress   ?? Arthritis   Symptoms   Symptoms include:   ?? Pain in the temporomandibular joint, jaw, or face   ?? Pain may be worse with chewing, yawning, or opening the mouth   ?? Clicking, popping, or grating sounds with movement of the jaw   ?? A sensation of the jaw catching or locking briefly, while attempting to open or close the mouth, or while chewing   ?? Difficulty opening the mouth completely   ?? A bite that feels off, uncomfortable, or as though it is frequently changing   ?? Swelling in the affected side of the face or  mouth   ?? Painful muscle spasm in the area of the temporomandibular joint   ?? Headache   ?? Earache   ?? Neck, back, and/or shoulder pain   Diagnosis   The doctor will ask about your symptoms and medical history. A physical exam will be done. The physical exam may include:   ?? Range of motion of the jaw tests   ?? Listening for sounds of popping or clicking in the temporomandibular joints   ?? Visual inspection of your teeth, temporomandibular joints, and muscles of your face and head   ?? Palpation of the joints and the muscles of the face and head   Other tests may include:   ?? X-rays plain x-rays or panoramic dental x-rays of the jaw and jaw joint   ?? Arthrographyjaw movements videotaped with x-rays taken after dye is injected into the joint   ?? MRI scan a test that uses magnetic waves to create detailed images of the joint   Treatment   Treatment may include:   Lifestyle Measures   ?? Rest the jaw with a soft diet   ?? Restrict movement with smaller bites   ?? Apply warm packs for pain relief   ?? Cognitive behavior therapy may  help some learn to avoid clenching and grinding their teeth   Medications   The most commonly used medicines include:   ?? Acetaminophen (Tylenol)   ?? Nonsteroidal anti-inflammatory drugs   ?? Muscle relaxants   ?? Low-dose antidepressants   Some medication may be injected into the jaw such as:   ?? Pain relieving medicine (eg, cortisone or lidocaine)   ?? Botulinum toxin (Botox)may offer temporary relief if pain or clicking are major symptoms   Physical Therapy   To help reduce pain and allow muscles to relax:   ?? Gentle massage or stretching exercises   ?? Transcutaneous electrical nerve stimulation (TENS)   Stress Reduction   Some may benefit from counseling to learn stress management and relaxation techniques, such as:   ?? Biofeedback   ?? Cognitive behavioral therapy BE   Dental Procedures   A splint or mouth guard can be made to relax your jaw muscles. This will prevent clenching and grinding of  your teeth. The guard is usually worn at night. Correction of bite abnormalities by a dentist or orthodontist is sometimes needed.   Surgical Procedures   Surgical correction is a last resort. Many of the available procedures have not been well-studied for their effectiveness.   Prevention   There are no guidelines for preventing TMD. If you have TMD, the following may help prevent symptoms:   ?? Ask your dentist if you need a night guard for grinding and clenching of the teeth.   ?? Try to limit jaw movements. Learn to relax your jaw. Block a yawn by putting your fist under your chin.   ?? Avoid extensive movements of the jaw.   ?? Don't chew gum.   ?? Learn relaxation techniques. Develop effective ways to cope with stress.   BE = This therapy has the best evidence available showing that it is effective.     Last Reviewed: September 2010 Etta Grandchild, MD   Updated: 03/17/2009

## 2010-06-29 LAB — POCT URINALYSIS DIPSTICK
Bilirubin, UA: NEGATIVE
Blood, UA POC: NEGATIVE
Glucose, UA POC: 2000
Ketones, UA: NEGATIVE
Leukocytes, UA: NEGATIVE
Nitrite, UA: NEGATIVE
Protein, UA POC: NEGATIVE
Spec Grav, UA: 1.025
Urobilinogen, UA: NEGATIVE
pH, UA: 5

## 2010-06-29 LAB — POCT MICROALBUMIN: Microalb, Ur: 20

## 2010-06-29 LAB — CBC
Hematocrit: 44.4 % (ref 40.5–52.5)
Hemoglobin: 15.4 gm/dl (ref 13.5–17.5)
MCH: 33.9 pg (ref 26–34)
MCHC: 34.8 gm/dl (ref 31–36)
MCV: 97.4 fl (ref 80–100)
MPV: 10.2 fl (ref 5.0–10.5)
Platelets: 159 10*3 (ref 135–450)
RBC: 4.56 10*6 (ref 4.2–5.9)
RDW: 12.2 % (ref 11.5–14.5)
WBC: 7.5 10*3 (ref 4.0–11.0)

## 2010-06-29 LAB — LIPID PANEL
Cholesterol, Total: 177 mg/dl (ref <200)
HDL: 57 mg/dl (ref 40–60)
LDL Calculated: 105 mg/dl — ABNORMAL HIGH (ref <100)
Triglycerides: 75 mg/dl (ref <150)
VLDL Cholesterol Calculated: 15 mg/dl

## 2010-06-29 LAB — COMPREHENSIVE METABOLIC PANEL
ALT: 29 U/L (ref 10–40)
AST: 20 U/L (ref 15–37)
Albumin/Globulin Ratio: 2.1 (ref 1.1–2.2)
Albumin: 4.4 gm/dl (ref 3.4–5.0)
Alkaline Phosphatase: 63 U/L (ref 45–129)
BUN: 15 mg/dl (ref 7–18)
CO2: 30 mEq/L (ref 21–32)
Calcium: 9.4 mg/dl (ref 8.3–10.6)
Chloride: 104 mEq/L (ref 99–110)
Creatinine: 1 mg/dl (ref 0.9–1.3)
GFR Est, African/Amer: >60
GFR, Estimated: >60 (ref >60)
Glucose: 247 mg/dl — ABNORMAL HIGH (ref 70–99)
Potassium: 4.9 mEq/L (ref 3.5–5.1)
Sodium: 140 mEq/L (ref 136–145)
Total Bilirubin: 0.6 mg/dl (ref 0.0–1.0)
Total Protein: 6.5 gm/dl (ref 6.4–8.2)

## 2010-06-29 MED ORDER — INSULIN GLARGINE 100 UNIT/ML SC SOLN
100 UNIT/ML | PEN_INJECTOR | Freq: Every day | SUBCUTANEOUS | Status: DC
Start: 2010-06-29 — End: 2010-08-24

## 2010-06-29 NOTE — Telephone Encounter (Signed)
Jeremiah Guzman is requesting a refill on Lantus sent to [facility].  He stopped today for labs and scheduled appointment for 07-17-2010 for a diabetic check.

## 2010-06-29 NOTE — Progress Notes (Signed)
Patient here for fasting blood work prior to his Diabetic appt

## 2010-06-30 LAB — HEMOGLOBIN A1C
Hemoglobin A1C: 9.3 — ABNORMAL HIGH (ref 4.0–6.0)
eAG: 220.2 mg/dl

## 2010-07-17 MED ORDER — SITAGLIPTIN-METFORMIN HCL 50-500 MG PO TABS
50-500 MG | ORAL_TABLET | Freq: Two times a day (BID) | ORAL | Status: DC
Start: 2010-07-17 — End: 2011-10-11

## 2010-07-17 MED ORDER — INSULIN LISPRO 100 UNIT/ML SC SOLN
100 UNIT/ML | PEN_INJECTOR | Freq: Three times a day (TID) | SUBCUTANEOUS | Status: DC
Start: 2010-07-17 — End: 2010-07-17

## 2010-07-17 MED ORDER — SILDENAFIL CITRATE 100 MG PO TABS
100 | ORAL_TABLET | ORAL | Status: DC | PRN
Start: 2010-07-17 — End: 2011-07-13

## 2010-07-17 MED ORDER — PHENTERMINE HCL 37.5 MG PO CAPS
37.5 MG | ORAL_CAPSULE | Freq: Every morning | ORAL | Status: DC
Start: 2010-07-17 — End: 2010-09-21

## 2010-07-17 MED ORDER — INSULIN LISPRO 100 UNIT/ML SC SOLN
100 UNIT/ML | PEN_INJECTOR | Freq: Three times a day (TID) | SUBCUTANEOUS | Status: DC
Start: 2010-07-17 — End: 2012-01-08

## 2010-07-17 MED ORDER — SILDENAFIL CITRATE 100 MG PO TABS
100 | ORAL_TABLET | ORAL | Status: DC | PRN
Start: 2010-07-17 — End: 2010-07-17

## 2010-07-17 MED ORDER — SITAGLIPTIN-METFORMIN HCL 50-500 MG PO TABS
50-500 MG | ORAL_TABLET | Freq: Two times a day (BID) | ORAL | Status: DC
Start: 2010-07-17 — End: 2010-07-17

## 2010-07-17 NOTE — Patient Instructions (Signed)
I have recommended the following steps for improving diabetic care and outcome to him: home glucose monitoring emphasized. A followup visit will be scheduled in the near future to review and reinforce the importance of careful diabetic control to improve long term outcomes.

## 2010-07-17 NOTE — Progress Notes (Signed)
Subjective:      Patient ID: Jeremiah Guzman is a 50 y.o. male.    Diabetes  He presents for his follow-up diabetic visit. He has type 2 diabetes mellitus. The initial diagnosis of diabetes was made 10 years ago. There are no hypoglycemic associated symptoms. Pertinent negatives for hypoglycemia include no confusion, dizziness, pallor, seizures or tremors. There are no diabetic associated symptoms. Pertinent negatives for diabetes include no fatigue. There are no hypoglycemic complications. Symptoms are stable. Current diabetic treatment includes insulin injections. He is compliant with treatment all of the time. Meal planning includes avoidance of concentrated sweets. He participates in exercise intermittently. His breakfast blood glucose range is generally 140-180 mg/dl. An ACE inhibitor/angiotensin II receptor blocker is being taken.       Review of Systems   Constitutional: Negative.  Negative for fever and fatigue.   HENT: Negative.  Negative for hearing loss, ear pain and sinus pressure.    Eyes: Negative.    Respiratory: Negative.  Negative for apnea, choking and stridor.    Cardiovascular: Negative.    Genitourinary: Negative for hematuria, discharge and penile swelling.   Musculoskeletal: Negative for back pain.   Skin: Negative for color change and pallor.   Neurological: Negative for dizziness, tremors, seizures and syncope.   Hematological: Does not bruise/bleed easily.   Psychiatric/Behavioral: Negative for confusion, dysphoric mood and decreased concentration.       Objective:   Physical Exam   Constitutional: He appears well-developed and well-nourished.   HENT:   Mouth/Throat: No oropharyngeal exudate.   Eyes: Conjunctivae and EOM are normal. Pupils are equal, round, and reactive to light. No scleral icterus.   Neck: Normal range of motion. No thyromegaly present.   Cardiovascular: Normal rate, regular rhythm and intact distal pulses.    Pulmonary/Chest: Effort normal. He has no wheezes. He exhibits no  tenderness.   Abdominal: Soft. He exhibits no mass. There is no tenderness.   Musculoskeletal: Normal range of motion. He exhibits no edema.   Lymphadenopathy:     He has no cervical adenopathy.   Neurological: He is alert. No cranial nerve deficit.   Skin: Skin is warm.   Psychiatric: His behavior is normal.       Assessment:      Encounter Diagnoses   Name Primary?   ??? DM (diabetes mellitus) Yes   ??? ED (erectile dysfunction)    ??? Obesity    ??? Obese            Plan:      Rosanne Ashing was seen today for diabetes.    Diagnoses and associated orders for this visit:    Dm (diabetes mellitus)  - Discontinue: sitaGLIPtan-metformin (JANUMET) 50-500 MG per tablet; Take 1 tablet by mouth 2 times daily (with meals) for 360 days.  - Discontinue: insulin lispro (HUMALOG PEN) 100 UNIT/ML injection; Inject 8 Units into the skin 3 times daily (before meals).  - sitaGLIPtan-metformin (JANUMET) 50-500 MG per tablet; Take 1 tablet by mouth 2 times daily (with meals) for 360 days.  - insulin lispro (HUMALOG PEN) 100 UNIT/ML injection; Inject 8 Units into the skin 3 times daily (before meals).    Ed (erectile dysfunction)  - Discontinue: sildenafil (VIAGRA) 100 MG tablet; Take 1 tablet by mouth as needed for Erectile Dysfunction.  - sildenafil (VIAGRA) 100 MG tablet; Take 1 tablet by mouth as needed for Erectile Dysfunction.    Obesity  - phentermine (ADIPEX-P) 37.5 MG capsule; Take 1 capsule by mouth every  morning.    Obese

## 2010-07-17 NOTE — Progress Notes (Signed)
BP Readings from Last 2 Encounters:   07/17/10 140/72   02/24/10 138/78     A1c (no units)   Date Value   07/29/2009 8.4*        Hemoglobin A1C (no units)   Date Value   06/29/2010 9.3*        LDL Calculated (mg/dl)   Date Value   1/61/0960 105*              Tobacco use:  Patient  reports that he has never smoked. He quit smokeless tobacco use about 5 months ago.    If Smoker - Cessation materials given? Yes    Is Daily aspirin on medication list?:  No    Diabetic retinal exam done? No   If yes, document in Health Maintenance.     Monofilament placed on counter? Yes    Shoes and socks removed? Yes    BP taken with correct size cuff? Yes   Repeated if > 130/80 No     Microalbumin performed if applicable?  No

## 2010-08-24 MED ORDER — INSULIN PEN NEEDLE 31G X 8 MM MISC
Freq: Two times a day (BID) | Status: DC
Start: 2010-08-24 — End: 2012-01-08

## 2010-08-24 MED ORDER — LANTUS SOLOSTAR 100 UNIT/ML SC SOPN
100 UNIT/ML | SUBCUTANEOUS | Status: DC
Start: 2010-08-24 — End: 2010-12-27

## 2010-09-22 MED ORDER — PHENTERMINE HCL 37.5 MG PO CAPS
37.5 MG | ORAL_CAPSULE | Freq: Every morning | ORAL | Status: DC
Start: 2010-09-22 — End: 2010-09-22

## 2010-09-22 MED ORDER — PHENTERMINE HCL 37.5 MG PO CAPS
37.5 MG | ORAL_CAPSULE | Freq: Every morning | ORAL | Status: DC
Start: 2010-09-22 — End: 2011-04-13

## 2010-12-27 MED ORDER — INSULIN GLARGINE 100 UNIT/ML SC SOLN
100 UNIT/ML | PEN_INJECTOR | Freq: Every day | SUBCUTANEOUS | Status: DC
Start: 2010-12-27 — End: 2011-07-13

## 2010-12-27 NOTE — Telephone Encounter (Signed)
Jeremiah Guzman states he needs Lantus sent to [facility] until his supply from [facility] comes in.

## 2011-03-23 MED ORDER — SIMVASTATIN 40 MG PO TABS
40 MG | ORAL_TABLET | Freq: Every day | ORAL | Status: DC
Start: 2011-03-23 — End: 2011-04-13

## 2011-03-23 MED ORDER — LISINOPRIL 10 MG PO TABS
10 MG | ORAL_TABLET | Freq: Every day | ORAL | Status: DC
Start: 2011-03-23 — End: 2011-04-13

## 2011-03-23 NOTE — Telephone Encounter (Signed)
Rosanne Ashing is requesting a refill on Lisinopril and Simvastatin sent to [facility].  Jim scheduled appointment on 04-10-2011 for his check up.

## 2011-04-10 LAB — COMPREHENSIVE METABOLIC PANEL
ALT: 27 U/L (ref 10–40)
AST: 18 U/L (ref 15–37)
Albumin/Globulin Ratio: 1.8 (ref 1.1–2.2)
Albumin: 4.4 gm/dl (ref 3.4–5.0)
Alkaline Phosphatase: 61 U/L (ref 45–129)
BUN: 13 mg/dl (ref 7–18)
CO2: 28 mEq/L (ref 21–32)
Calcium: 9.3 mg/dl (ref 8.3–10.6)
Chloride: 104 mEq/L (ref 99–110)
Creatinine: 1.1 mg/dl (ref 0.9–1.3)
GFR Est, African/Amer: >60
GFR, Estimated: >60 (ref >60)
Glucose: 237 mg/dl — ABNORMAL HIGH (ref 70–99)
Potassium: 4.7 mEq/L (ref 3.5–5.1)
Sodium: 142 mEq/L (ref 136–145)
Total Bilirubin: 0.6 mg/dl (ref 0.0–1.0)
Total Protein: 6.8 gm/dl (ref 6.4–8.2)

## 2011-04-10 LAB — LIPID PANEL
Cholesterol, Total: 190 mg/dl (ref <200)
HDL: 62 mg/dl — ABNORMAL HIGH (ref 40–60)
LDL Calculated: 111 mg/dl — ABNORMAL HIGH (ref <100)
Triglycerides: 85 mg/dl (ref <150)
VLDL Cholesterol Calculated: 17 mg/dl

## 2011-04-10 NOTE — Progress Notes (Signed)
Patient here for fasting blood work for his upcoming Diabetic check up.

## 2011-04-11 LAB — HEMOGLOBIN A1C
Hemoglobin A1C: 9.8
eAG: 234.6 mg/dl

## 2011-04-13 LAB — POCT URINALYSIS DIPSTICK
Bilirubin, UA: NEGATIVE
Blood, UA POC: NEGATIVE
Glucose, UA POC: 500
Ketones, UA: NEGATIVE
Leukocytes, UA: NEGATIVE
Nitrite, UA: NEGATIVE
Protein, UA POC: NEGATIVE
Spec Grav, UA: 1.01
Urobilinogen, UA: NEGATIVE
pH, UA: 5

## 2011-04-13 LAB — POCT MICROALBUMIN: Microalb, Ur: NEGATIVE

## 2011-04-13 MED ORDER — SIMVASTATIN 40 MG PO TABS
40 MG | ORAL_TABLET | Freq: Every day | ORAL | Status: DC
Start: 2011-04-13 — End: 2012-01-08

## 2011-04-13 MED ORDER — LISINOPRIL 10 MG PO TABS
10 MG | ORAL_TABLET | Freq: Every day | ORAL | Status: DC
Start: 2011-04-13 — End: 2012-01-08

## 2011-04-13 MED ORDER — PHENTERMINE HCL 37.5 MG PO CAPS
37.5 MG | ORAL_CAPSULE | Freq: Every morning | ORAL | Status: DC
Start: 2011-04-13 — End: 2011-05-14

## 2011-04-13 NOTE — Progress Notes (Signed)
BP Readings from Last 2 Encounters:   04/13/11 118/64   07/17/10 140/72     A1c (no units)   Date Value   07/29/2009 8.4*        Hemoglobin A1C (no units)   Date Value   04/10/2011 9.8         LDL Calculated (mg/dl)   Date Value   0/09/8117 111*              Tobacco use:  Patient  reports that he has never smoked. He quit smokeless tobacco use about 14 months ago.    If Smoker - Cessation materials given? NA    Is Daily aspirin on medication list?:  No    Diabetic retinal exam done? Yes, [facility]   If yes, document in Health Maintenance.     Monofilament placed on counter? Yes    Shoes and socks removed? Yes    BP taken with correct size cuff? Yes   Repeated if > 130/80 No     Microalbumin performed if applicable?  Yes, will obtain today    Good Sensation in Both Feet.

## 2011-04-13 NOTE — Patient Instructions (Signed)
I have recommended the following steps for improving diabetic care and outcome to him: home glucose monitoring emphasized. A followup visit will be scheduled in the near future to review and reinforce the importance of careful diabetic control to improve long term outcomes.

## 2011-04-13 NOTE — Progress Notes (Signed)
Subjective:      Patient ID: Jeremiah Guzman is a 51 y.o. male.    Diabetes  He presents for his follow-up diabetic visit. He has type 2 diabetes mellitus. The initial diagnosis of diabetes was made 12 years ago. There are no hypoglycemic associated symptoms. Pertinent negatives for hypoglycemia include no confusion, dizziness, headaches, pallor, seizures or tremors. Associated symptoms include fatigue, polydipsia and polyuria. Pertinent negatives for diabetes include no blurred vision, no chest pain, no foot paresthesias, no visual change, no weakness and no weight loss. There are no hypoglycemic complications. Symptoms are stable. Diabetic complications include impotence, PVD and retinopathy. Pertinent negatives for diabetic complications include no autonomic neuropathy, CVA, heart disease, nephropathy or peripheral neuropathy. Risk factors for coronary artery disease include diabetes mellitus, dyslipidemia, family history, hypertension, male sex and obesity. Current diabetic treatment includes oral agent (dual therapy) and insulin injections. He is compliant with treatment most of the time. His weight is stable. He is following a generally healthy diet. He has not had a previous visit with a dietician. He participates in exercise every other day. His home blood glucose trend is fluctuating dramatically. His breakfast blood glucose range is generally 140-180 mg/dl. An ACE inhibitor/angiotensin II receptor blocker is being taken. Eye exam is current.       Review of Systems   Constitutional: Positive for fatigue. Negative for fever, weight loss and appetite change.   HENT: Negative.  Negative for hearing loss, ear pain and sinus pressure.    Eyes: Negative.  Negative for blurred vision and visual disturbance.   Respiratory: Negative.  Negative for apnea, choking and stridor.    Cardiovascular: Negative for chest pain.   Gastrointestinal: Positive for diarrhea.   Endocrine: Positive for polydipsia and polyuria.    Genitourinary: Positive for frequency and impotence. Negative for hematuria, discharge, penile swelling and penile pain.   Musculoskeletal: Positive for arthralgias. Negative for back pain.   Skin: Negative.  Negative for color change and pallor.   Neurological: Negative for dizziness, tremors, seizures, syncope, weakness and headaches.   Hematological: Does not bruise/bleed easily.   Psychiatric/Behavioral: Negative.  Negative for confusion, dysphoric mood and decreased concentration.       Objective:   Physical Exam   Constitutional: He appears well-developed and well-nourished.   HENT:   Mouth/Throat: No oropharyngeal exudate.   Eyes: Conjunctivae and EOM are normal. Pupils are equal, round, and reactive to light. No scleral icterus.   Neck: Normal range of motion. No thyromegaly present.   Cardiovascular: Normal rate, regular rhythm and intact distal pulses.    Pulmonary/Chest: Effort normal. He has no wheezes. He exhibits no tenderness.   Abdominal: Soft. He exhibits no mass. There is no tenderness.   Musculoskeletal: Normal range of motion. He exhibits no edema.   Lymphadenopathy:     He has no cervical adenopathy.   Neurological: He is alert. No cranial nerve deficit.   Skin: Skin is warm.   Psychiatric: His behavior is normal.       Assessment:      Encounter Diagnoses   Name Primary?   . Obesity Yes   . DM (diabetes mellitus)             Plan:      Jeremiah Guzman was seen today for diabetes.    Diagnoses and associated orders for this visit:    Obesity  - phentermine (ADIPEX-P) 37.5 MG capsule; Take 1 capsule by mouth every morning for 30 days.  DM (diabetes mellitus)  - Ambulatory referral to Endocrinology    Other Orders  - ibuprofen (ADVIL;MOTRIN) 200 MG tablet; Take 200 mg by mouth every 6 hours as needed.  - lisinopril (PRINIVIL;ZESTRIL) 10 MG tablet; Take 1 tablet by mouth daily.  - simvastatin (ZOCOR) 40 MG tablet; Take 1 tablet by mouth daily.

## 2011-05-14 MED ORDER — PHENTERMINE HCL 37.5 MG PO CAPS
37.5 MG | ORAL_CAPSULE | Freq: Every morning | ORAL | Status: AC
Start: 2011-05-14 — End: 2011-06-13

## 2011-05-14 NOTE — Progress Notes (Signed)
Patient here for a weight check. Patient weighed 256.2 one month ago and now weighs 248.2lb patient has lost 8lbs    Rx printed for dr Selena Batten to sign. Patient instructed to return in one month for weigh in. Pt needs to lose more than 3 lbs to continue on medication.

## 2011-07-13 LAB — COMPREHENSIVE METABOLIC PANEL
ALT: 30 U/L (ref 10–40)
AST: 28 U/L (ref 15–37)
Albumin/Globulin Ratio: 1.8 (ref 1.1–2.2)
Albumin: 4.4 g/dL (ref 3.4–5.0)
Alkaline Phosphatase: 67 U/L (ref 45–129)
BUN: 15 mg/dL (ref 7–18)
CO2: 27 mEq/L (ref 21–32)
Calcium: 9.7 mg/dL (ref 8.3–10.6)
Chloride: 106 mEq/L (ref 99–110)
Creatinine: 1 mg/dL (ref 0.9–1.3)
GFR African American: >60 (ref >60)
GFR Non-African American: >60 (ref >60)
Globulin: 2 g/dL
Glucose: 167 mg/dL — ABNORMAL HIGH (ref 70–99)
Potassium: 4.9 mEq/L (ref 3.5–5.1)
Sodium: 146 mEq/L — ABNORMAL HIGH (ref 136–145)
Total Bilirubin: 0.5 mg/dL (ref 0.00–1.00)
Total Protein: 6.8 g/dL (ref 6.4–8.2)

## 2011-07-13 LAB — POCT URINALYSIS DIPSTICK
Bilirubin, UA: NEGATIVE
Blood, UA POC: NEGATIVE
Glucose, UA POC: NEGATIVE
Ketones, UA: NEGATIVE
Leukocytes, UA: NEGATIVE
Nitrite, UA: NEGATIVE
Protein, UA POC: NEGATIVE
Spec Grav, UA: 1.015
Urobilinogen, UA: NEGATIVE
pH, UA: 5

## 2011-07-13 LAB — LIPID PANEL
Cholesterol, Total: 166 mg/dL (ref 0–199)
HDL: 66 mg/dL — ABNORMAL HIGH (ref 40–60)
LDL Calculated: 88 mg/dL (ref 66–178)
Triglycerides: 61 mg/dL (ref 0–149)
VLDL Cholesterol Calculated: 12 mg/dL

## 2011-07-13 LAB — TSH: TSH: 1.42 u[IU]/mL (ref 0.35–5.50)

## 2011-07-13 LAB — CBC
Hematocrit: 46.6 % (ref 40.5–52.5)
Hemoglobin: 16 g/dL (ref 13.5–17.5)
MCH: 33.4 pg (ref 26.0–34.0)
MCHC: 34.3 g/dL (ref 31.0–36.0)
MCV: 97.4 fL (ref 80.0–100.0)
MPV: 9.6 fL (ref 5.0–10.5)
Platelets: 189 10*3/uL (ref 135–450)
RBC: 4.78 M/uL (ref 4.20–5.90)
RDW: 12.5 % (ref 12.4–15.4)
WBC: 8.7 10*3/uL (ref 4.0–11.0)

## 2011-07-13 LAB — POCT MICROALBUMIN: Microalb, Ur: NEGATIVE

## 2011-07-13 LAB — PSA SCREENING: PSA: 0.88 ng/mL (ref 0.00–4.00)

## 2011-07-13 MED ORDER — PHENTERMINE HCL 37.5 MG PO TABS
37.5 MG | ORAL_TABLET | Freq: Every day | ORAL | Status: DC
Start: 2011-07-13 — End: 2011-10-11

## 2011-07-13 MED ORDER — SILDENAFIL CITRATE 100 MG PO TABS
100 | ORAL_TABLET | ORAL | Status: DC | PRN
Start: 2011-07-13 — End: 2012-07-12

## 2011-07-13 MED ORDER — INSULIN GLARGINE 100 UNIT/ML SC SOLN
100 UNIT/ML | PEN_INJECTOR | Freq: Every day | SUBCUTANEOUS | Status: DC
Start: 2011-07-13 — End: 2011-09-19

## 2011-07-13 NOTE — Progress Notes (Signed)
BP Readings from Last 2 Encounters:   07/13/11 138/70   04/13/11 118/64     A1c (no units)   Date Value   07/29/2009 8.4*        Hemoglobin A1C (no units)   Date Value   04/10/2011 9.8         LDL Calculated (mg/dl)   Date Value   08/11/6960 111*              Tobacco use:  Patient  reports that he has never smoked. He quit smokeless tobacco use about 17 months ago.    If Smoker - Cessation materials given? NA    Is Daily aspirin on medication list?:  No    Diabetic retinal exam done? Yes   If yes, document in Health Maintenance.     Monofilament placed on counter? Yes    Shoes and socks removed? Yes    BP taken with correct size cuff? Yes   Repeated if > 130/80 No     Microalbumin performed if applicable?  Yes, will obtain today

## 2011-07-13 NOTE — Progress Notes (Signed)
Subjective:      Patient ID: Jeremiah Guzman is a 51 y.o. male.    Diabetes  He presents for his follow-up diabetic visit. He has type 2 diabetes mellitus. The initial diagnosis of diabetes was made 10 years ago. His disease course has been fluctuating. There are no hypoglycemic associated symptoms. Pertinent negatives for hypoglycemia include no confusion, dizziness, pallor, seizures or tremors. Associated symptoms include fatigue. Pertinent negatives for diabetes include no blurred vision, no chest pain, no foot paresthesias and no polyphagia. There are no hypoglycemic complications. Symptoms are stable. Diabetic complications include impotence. Pertinent negatives for diabetic complications include no heart disease or nephropathy. Risk factors for coronary artery disease include diabetes mellitus, dyslipidemia, hypertension, male sex and sedentary lifestyle. Current diabetic treatment includes oral agent (dual therapy) and insulin injections. He is compliant with treatment all of the time. He is following a diabetic diet. He participates in exercise every other day. His breakfast blood glucose range is generally 140-180 mg/dl. An ACE inhibitor/angiotensin II receptor blocker is being taken. Eye exam is current.       Review of Systems   Constitutional: Positive for fatigue.   HENT: Negative.  Negative for hearing loss, ear pain and sinus pressure.    Eyes: Negative.  Negative for blurred vision.   Respiratory: Negative.  Negative for apnea, choking and stridor.    Cardiovascular: Negative.  Negative for chest pain.   Gastrointestinal: Negative.    Endocrine: Negative for polyphagia.   Genitourinary: Positive for impotence. Negative for hematuria and penile pain.   Musculoskeletal: Positive for arthralgias. Negative for back pain.   Skin: Negative.  Negative for color change and pallor.   Neurological: Negative for dizziness, tremors, seizures and syncope.   Hematological: Does not bruise/bleed easily.    Psychiatric/Behavioral: Negative.  Negative for confusion, dysphoric mood and decreased concentration.       Objective:   Physical Exam   Constitutional: He appears well-developed and well-nourished.   HENT:   Mouth/Throat: No oropharyngeal exudate.   Eyes: Conjunctivae and EOM are normal. Pupils are equal, round, and reactive to light. No scleral icterus.   Neck: Normal range of motion. No thyromegaly present.   Cardiovascular: Normal rate, regular rhythm and intact distal pulses.    Pulmonary/Chest: Effort normal. He has no wheezes. He exhibits no tenderness.   Abdominal: Soft. He exhibits no mass. There is no tenderness.   Musculoskeletal: Normal range of motion. He exhibits no edema.   Lymphadenopathy:     He has no cervical adenopathy.   Neurological: He is alert. No cranial nerve deficit.   Skin: Skin is warm.   Psychiatric: His behavior is normal.       Assessment:      Encounter Diagnoses   Name Primary?   ??? Type 1 diabetes mellitus, uncontrolled Yes   ??? ED (erectile dysfunction)    ??? Obese    ??? Hyperlipidemia    ??? Special screening for malignant neoplasm of prostate             Plan:      Jeremiah Guzman was seen today for 3 month follow-up.    Diagnoses and associated orders for this visit:    Type 1 diabetes mellitus, uncontrolled  - POCT microalbumin  - insulin glargine (LANTUS SOLOSTAR) 100 UNIT/ML injection; Inject 60 Units into the skin daily.  - Hemoglobin A1c  - Comprehensive metabolic panel  - CBC  - POCT Urinalysis no Micro    ED (erectile dysfunction)  -  sildenafil (VIAGRA) 100 MG tablet; Take 1 tablet by mouth as needed for Erectile Dysfunction.  - POCT Urinalysis no Micro  - Testosterone free and total male    Obese  - phentermine (ADIPEX-P) 37.5 MG tablet; Take 1 tablet by mouth every morning (before breakfast).    Hyperlipidemia  - Lipid panel  - Comprehensive metabolic panel  - TSH    Special screening for malignant neoplasm of prostate  - Psa screening    Other Orders  - Discontinue: phentermine  (ADIPEX-P) 37.5 MG tablet;   - aspirin 81 MG tablet; Take 81 mg by mouth daily.

## 2011-07-14 LAB — HEMOGLOBIN A1C
Hemoglobin A1C: 8.4 %
eAG: 194.4 mg/dL

## 2011-07-16 LAB — TESTOSTERONE FREE AND TOTAL MALE
Sex Hormone Binding: 28 nmol/L (ref 11–80)
Testosterone % Free: 1.9 % (ref 1.6–2.9)
Testosterone Free, Calc: 53 pg/mL (ref 47–244)
Total Testosterone: 271 ng/dL — ABNORMAL LOW (ref 300–890)

## 2011-08-02 MED ORDER — DIPHENOXYLATE-ATROPINE 2.5-0.025 MG PO TABS
ORAL_TABLET | Freq: Four times a day (QID) | ORAL | Status: AC | PRN
Start: 2011-08-02 — End: 2011-08-12

## 2011-08-02 MED ORDER — CIPROFLOXACIN HCL 500 MG PO TABS
500 MG | ORAL_TABLET | Freq: Two times a day (BID) | ORAL | Status: AC
Start: 2011-08-02 — End: 2011-08-12

## 2011-08-02 NOTE — Progress Notes (Signed)
Subjective:      Patient ID: Jeremiah Guzman is a 51 y.o. male.    Diarrhea   This is a new problem. The current episode started 1 to 4 weeks ago. The problem occurs more than 10 times per day. The problem has been gradually worsening. The stool consistency is described as watery. Associated symptoms include abdominal pain, chills, a fever and vomiting. Nothing aggravates the symptoms. Risk factors include travel to endemic area. He has tried anti-motility drug for the symptoms. The treatment provided no relief.       Review of Systems   Constitutional: Positive for fever and chills.   HENT: Negative for hearing loss, ear pain and sinus pressure.    Eyes: Negative.    Respiratory: Negative for apnea, choking and stridor.    Gastrointestinal: Positive for vomiting, abdominal pain and diarrhea.   Genitourinary: Negative.  Negative for hematuria, penile swelling and penile pain.   Musculoskeletal: Negative for back pain.   Skin: Negative for color change and pallor.   Neurological: Negative for dizziness, tremors, seizures and syncope.   Hematological: Does not bruise/bleed easily.   Psychiatric/Behavioral: Negative for confusion, dysphoric mood and decreased concentration.       Objective:   Physical Exam   Constitutional: He appears well-developed and well-nourished.   HENT:   Mouth/Throat: No oropharyngeal exudate.   Eyes: Conjunctivae and EOM are normal. Pupils are equal, round, and reactive to light. No scleral icterus.   Neck: Normal range of motion. No thyromegaly present.   Cardiovascular: Normal rate, regular rhythm and intact distal pulses.    Pulmonary/Chest: Effort normal. He has no wheezes. He exhibits no tenderness.   Abdominal: Soft. He exhibits no mass. There is tenderness.   Musculoskeletal: Normal range of motion. He exhibits no edema.   Lymphadenopathy:     He has no cervical adenopathy.   Neurological: He is alert. No cranial nerve deficit.   Skin: Skin is warm.   Psychiatric: His behavior is normal.        Assessment:      Encounter Diagnosis   Name Primary?   ??? Traveler's diarrhea Yes            Plan:      Jeremiah Guzman was seen today for diarrhea.    Diagnoses and associated orders for this visit:    Traveler's diarrhea  - diphenoxylate-atropine (DIPHENATOL) 2.5-0.025 MG per tablet; Take 2 tablets by mouth 4 times daily as needed for Diarrhea for 10 days.  - ciprofloxacin (CIPRO) 500 MG tablet; Take 1 tablet by mouth 2 times daily for 10 days.

## 2011-09-20 MED ORDER — LANTUS SOLOSTAR 100 UNIT/ML SC SOPN
100 UNIT/ML | PEN_INJECTOR | SUBCUTANEOUS | Status: DC
Start: 2011-09-20 — End: 2012-01-08

## 2011-10-02 NOTE — Progress Notes (Signed)
Patient here for fasting Diabetic blood work.

## 2011-10-03 LAB — COMPREHENSIVE METABOLIC PANEL
ALT: 35 U/L (ref 10–40)
AST: 24 U/L (ref 15–37)
Albumin/Globulin Ratio: 1.7 (ref 1.1–2.2)
Albumin: 4.6 g/dL (ref 3.4–5.0)
Alkaline Phosphatase: 69 U/L (ref 45–129)
BUN: 15 mg/dL (ref 7–18)
CO2: 30 mEq/L (ref 21–32)
Calcium: 9.6 mg/dL (ref 8.3–10.6)
Chloride: 104 mEq/L (ref 99–110)
Creatinine: 1 mg/dL (ref 0.9–1.3)
GFR African American: >60 (ref >60)
GFR Non-African American: >60 (ref >60)
Globulin: 3 g/dL
Glucose: 136 mg/dL — ABNORMAL HIGH (ref 70–99)
Potassium: 4.3 mEq/L (ref 3.5–5.1)
Sodium: 142 mEq/L (ref 136–145)
Total Bilirubin: 0.7 mg/dL (ref 0.00–1.00)
Total Protein: 7.3 g/dL (ref 6.4–8.2)

## 2011-10-03 LAB — LIPID PANEL
Cholesterol, Total: 196 mg/dL (ref 0–199)
HDL: 60 mg/dL (ref 40–60)
LDL Calculated: 115 mg/dL — ABNORMAL HIGH (ref 0–99)
Triglycerides: 104 mg/dL (ref 0–149)
VLDL Cholesterol Calculated: 21 mg/dL

## 2011-10-03 LAB — HEMOGLOBIN A1C
Hemoglobin A1C: 8.3 %
eAG: 191.5 mg/dL

## 2011-10-11 LAB — POCT URINALYSIS DIPSTICK
Bilirubin, UA: NEGATIVE
Blood, UA POC: NEGATIVE
Glucose, UA POC: 2000
Ketones, UA: NEGATIVE
Leukocytes, UA: NEGATIVE
Nitrite, UA: NEGATIVE
Protein, UA POC: NEGATIVE
Spec Grav, UA: 1.015
Urobilinogen, UA: NEGATIVE
pH, UA: 5

## 2011-10-11 MED ORDER — TESTOSTERONE 30 MG/ACT TD SOLN
30 MG/ACT | Freq: Every day | TRANSDERMAL | Status: DC
Start: 2011-10-11 — End: 2011-10-12

## 2011-10-11 MED ORDER — SITAGLIPTIN-METFORMIN HCL 50-500 MG PO TABS
50-500 MG | ORAL_TABLET | Freq: Two times a day (BID) | ORAL | Status: DC
Start: 2011-10-11 — End: 2012-01-08

## 2011-10-11 MED ORDER — PHENTERMINE HCL 37.5 MG PO TABS
37.5 MG | ORAL_TABLET | Freq: Every day | ORAL | Status: DC
Start: 2011-10-11 — End: 2011-12-18

## 2011-10-11 NOTE — Progress Notes (Signed)
Subjective:      Patient ID: Jeremiah Guzman is a 51 y.o. male.    HPI Comments: Patient presents with:    3 Month Follow-Up - Patient here for a 3 month follow up on his Diabetes and to review his Blood Test Results.           Review of Systems   Constitutional: Negative.    HENT: Negative.  Negative for hearing loss, ear pain and sinus pressure.    Eyes: Positive for visual disturbance.   Respiratory: Negative.  Negative for apnea, choking and stridor.    Cardiovascular: Negative.    Genitourinary: Negative.  Negative for hematuria, penile swelling and penile pain.   Musculoskeletal: Negative.  Negative for back pain.   Skin: Negative.  Negative for color change and pallor.   Neurological: Negative.  Negative for dizziness, tremors, seizures and syncope.   Hematological: Does not bruise/bleed easily.   Psychiatric/Behavioral: Negative.  Negative for confusion, dysphoric mood and decreased concentration.       Objective:   Physical Exam   Constitutional: He appears well-developed and well-nourished.   HENT:   Mouth/Throat: No oropharyngeal exudate.   Eyes: Conjunctivae and EOM are normal. Pupils are equal, round, and reactive to light. No scleral icterus.   Neck: Normal range of motion. No thyromegaly present.   Cardiovascular: Normal rate, regular rhythm and intact distal pulses.    Pulmonary/Chest: Effort normal. He has no wheezes. He exhibits no tenderness.   Abdominal: Soft. He exhibits no mass. There is no tenderness.   Musculoskeletal: Normal range of motion. He exhibits no edema.   Lymphadenopathy:     He has no cervical adenopathy.   Neurological: He is alert. No cranial nerve deficit.   Skin: Skin is warm.   Psychiatric: His behavior is normal.       Assessment:      Encounter Diagnoses   Name Primary?   ??? Type 1 diabetes mellitus, uncontrolled Yes   ??? Obese    ??? Hypogonadism male             Plan:      Jeremiah Guzman was seen today for 3 month follow-up.    Diagnoses and associated orders for this visit:    Type 1  diabetes mellitus, uncontrolled  - Microalbumin / creatinine urine ratio    Obese  - phentermine (ADIPEX-P) 37.5 MG tablet; Take 1 tablet by mouth every morning (before breakfast).    Hypogonadism male  - Testosterone 30 MG/ACT SOLN; Place 1 Container onto the skin daily.    Other Orders  - Multiple Vitamin TABS; Take  by mouth daily.  - sitaGLIPtan-metformin (JANUMET) 50-500 MG per tablet; Take 1 tablet by mouth 2 times daily (with meals) for 360 days.

## 2011-10-11 NOTE — Progress Notes (Signed)
BP Readings from Last 2 Encounters:   10/11/11 118/64   08/02/11 124/68     A1c (no units)   Date Value   07/29/2009 8.4*        Hemoglobin A1C (%)   Date Value   10/02/2011 8.3         LDL Calculated (mg/dL)   Date Value   9/60/4540 115*              Tobacco use:  Patient  reports that he has never smoked. He quit smokeless tobacco use about 20 months ago.    If Smoker - Cessation materials given? NA    Is Daily aspirin on medication list?:  Yes    Diabetic retinal exam done? Yes, 11/16/10   If yes, document in Health Maintenance.     Monofilament placed on counter? Yes    Shoes and socks removed? Yes    BP taken with correct size cuff? Yes   Repeated if > 130/80 No     Microalbumin performed if applicable?  Yes, will obtain today    Good Sensation in both feet.

## 2011-10-11 NOTE — Addendum Note (Signed)
Addended by: Algis Greenhouse on: 10/11/2011 04:23 PM     Modules accepted: Orders

## 2011-10-12 LAB — MICROALBUMIN / CREATININE URINE RATIO
Creatinine, Ur: 67 mg/dL
Microalbumin Creatinine Ratio: 13.3 mg/g (ref 0.0–30.0)
Microalbumin, Random Urine: 0.89 mg/dL (ref 0.00–1.80)

## 2011-10-12 MED ORDER — TESTOSTERONE 25 MG/2.5GM (1%) TD GEL
25 MG/2.5GM (1%) | PACK | TRANSDERMAL | Status: DC
Start: 2011-10-12 — End: 2011-12-05

## 2011-10-12 NOTE — Telephone Encounter (Signed)
i change his Axirone to Androgen patch.

## 2011-10-12 NOTE — Telephone Encounter (Signed)
Jeremiah Guzman [phone] states AXIRON needs PA.   Plan does not cover this medication. Please call plan at [phone] to initiate prior authorization or call/fax pharmacy to change medication. Patient ID [id number]. Step Therapy required - Androgel will be covered per insurance.

## 2011-12-05 MED ORDER — TESTOSTERONE 25 MG/2.5GM (1%) TD GEL
25 MG/2.5GM (1%) | PACK | TRANSDERMAL | Status: DC
Start: 2011-12-05 — End: 2012-07-31

## 2011-12-05 NOTE — Telephone Encounter (Signed)
Rx of Androgel 1% gel packets(2.5gram per packet,#30 per bx) # 90 packets with 1 refill called to [facility]

## 2011-12-18 LAB — CBC WITH AUTO DIFFERENTIAL
Basophils %: 0.3 %
Basophils Absolute: 0 10*3/uL (ref 0.0–0.2)
Eosinophils %: 1.9 %
Eosinophils Absolute: 0.2 10*3/uL (ref 0.0–0.6)
Hematocrit: 46.6 % (ref 40.5–52.5)
Hemoglobin: 15.3 g/dL (ref 13.5–17.5)
Lymphocytes %: 13 %
Lymphocytes Absolute: 1.3 10*3/uL (ref 1.0–5.1)
MCH: 32.5 pg (ref 26.0–34.0)
MCHC: 32.8 g/dL (ref 31.0–36.0)
MCV: 99.2 fL (ref 80.0–100.0)
MPV: 10.4 fL (ref 5.0–10.5)
Monocytes %: 11 %
Monocytes Absolute: 1.1 10*3/uL (ref 0.0–1.3)
Neutrophils %: 73.8 %
Neutrophils Absolute: 7.4 10*3/uL (ref 1.7–7.7)
Platelets: 183 10*3/uL (ref 135–450)
RBC: 4.7 M/uL (ref 4.20–5.90)
RDW: 12.2 % — ABNORMAL LOW (ref 12.4–15.4)
WBC: 10.1 10*3/uL (ref 4.0–11.0)

## 2011-12-18 MED ORDER — BENZONATATE 100 MG PO CAPS
100 MG | ORAL_CAPSULE | Freq: Three times a day (TID) | ORAL | Status: DC | PRN
Start: 2011-12-18 — End: 2012-01-08

## 2011-12-18 MED ORDER — METHYLPREDNISOLONE 4 MG PO TBPK
4 MG | PACK | ORAL | Status: DC
Start: 2011-12-18 — End: 2012-01-08

## 2011-12-18 MED ORDER — LIDOCAINE VISCOUS 2 % MT SOLN
2 % | Freq: Four times a day (QID) | OROMUCOSAL | Status: DC | PRN
Start: 2011-12-18 — End: 2012-01-08

## 2011-12-18 MED ORDER — AMOXICILLIN-POT CLAVULANATE 875-125 MG PO TABS
875-125 MG | ORAL_TABLET | Freq: Two times a day (BID) | ORAL | Status: AC
Start: 2011-12-18 — End: 2011-12-28

## 2011-12-18 NOTE — Patient Instructions (Signed)
Try to push more fluids  Neti Pot at bedtime and as needed ([employer] or [employer] for the ceramic ones)  Antibiotics until complete (Rx given)  (Augmentin)   Steroids pack as directed (Rx given) - no NSAID's like Ibuprofen/Naprosyn until complete  Rx for cough medicine as directed (Tessalon)  Antihistamine of choice (Claritin, Zyrtec, Allegra) as needed for postnasal dripNasonex/Flonase as needed for ear/nasal congestion (Rx given)  Coricidin HBP as needed for any additional congestion  Tylenol/Ibuprofen as needed for pain / fever  Follow up in 5-7 days if not improving   Lymphadenopathy instructions given        Swollen Lymph Nodes: After Your Visit  Your Care Instructions  Lymph nodes are small, bean-shaped glands throughout the body. They help your body fight germs and infections.  Lymph nodes often swell when there is a problem such as an injury, infection, or tumor.  ?? The nodes in your neck, under your chin, or behind your ears may swell when you have a cold or sore throat.  ?? An injury or infection in a leg or foot can make the nodes in your groin swell.  ?? Sometimes medicine can make lymph nodes swell, but this is rare.  Treatment depends on what caused your nodes to swell. Usually the nodes return to normal size without a problem.  Follow-up care is a key part of your treatment and safety. Be sure to make and go to all appointments, and call your doctor if you are having problems. It???s also a good idea to know your test results and keep a list of the medicines you take.  How can you care for yourself at home?  ?? Take your medicines exactly as prescribed. Call your doctor if you think you are having a problem with your medicine.  ?? Avoid irritation.  ?? Do not squeeze or pick at the lump.  ?? Do not stick a needle in it.  ?? Prevent infection. Do not squeeze, drain, or puncture a painful lump. Doing this can irritate or inflame the lump, push any existing infection deeper into the skin, or cause  severe bleeding.  ?? Get extra rest. Slow down just a little from your usual routine.  ?? Drink plenty of fluids, enough so that your urine is light yellow or clear like water. If you have kidney, heart, or liver disease and have to limit fluids, talk with your doctor before you increase the amount of fluids you drink.  ?? Take an over-the-counter pain medicine, such as acetaminophen (Tylenol), ibuprofen (Advil, Motrin), or naproxen (Aleve). Read and follow all instructions on the label.  ?? Do not take two or more pain medicines at the same time unless the doctor told you to. Many pain medicines have acetaminophen, which is Tylenol. Too much acetaminophen (Tylenol) can be harmful.  When should you call for help?  Watch closely for changes in your health, and be sure to contact your doctor if:  ?? Your lymph nodes do not get smaller, or they get bigger.  ?? The area becomes red and feels tender.  ?? The nodes feel hard and do not move when you push on them.  ?? You have a fever of 100?? F.  ?? You have night sweats.  ?? You lose weight without trying.    Where can you learn more?    Go to [email or web address] and sign in to your MyChart account. Enter [phone] in the Search Health Information box to  learn more about ???Swollen Lymph Nodes: After Your Visit.???    If you do not have an account, please click on the ???Sign Up Now??? link.      ?? 2006-2013 [employer]. Care instructions adapted under license by [facility]. This care instruction is for use with your licensed healthcare professional. If you have questions about a medical condition or this instruction, always ask your healthcare professional. [employer] disclaims any warranty or liability for your use of this information.  Content Version: 9.7.130178; Last Revised: June 10, 2009

## 2011-12-18 NOTE — Progress Notes (Signed)
Subjective:      Patient ID: Jeremiah Guzman is a 51 y.o. male.    Chief Complaint   Patient presents with   ??? URI     Patient states that he started one week ago with Headache and Neck Pain that has subsided. Patient now with Sore Throat and Cough. Not coughing up any Sputum. No Nasal d/c. Throat hurts to swallow. No fever. Using Chloriseptic Throat Spray with no relief.        Nursing note/chief complaint reviewed and discussed with patient.    Vital signs, Allergies. Past Medical, Social  and Surgical Histories as well as Family History reviewed and discussed with patient.      BP 126/60   Pulse 76   Temp(Src) 98 ??F (36.7 ??C) (Oral)   Resp 16   Ht 5\' 11"  (1.803 m)   Wt 257 lb 6.4 oz (116.756 kg)   BMI 35.92 kg/m2      URI   This is a new problem. Episode onset: 1 week ago. The problem has been gradually worsening. Maximum temperature: felt hot initially. The fever has been present for less than 1 day. Associated symptoms include chest pain (upper mid chest), coughing (non-productive), headaches, neck pain, a sore throat and swollen glands. Pertinent negatives include no abdominal pain, diarrhea, ear pain, joint pain, plugged ear sensation or sinus pain. Treatments tried: ASA and chloreseptic. The treatment provided mild (with the ASA) relief.       Review of Systems   Constitutional: Positive for unexpected weight change (gained weight 7 lbs in 3 past weeks).   HENT: Positive for sore throat and neck pain. Negative for ear pain.    Respiratory: Positive for cough (non-productive).    Cardiovascular: Positive for chest pain (upper mid chest).   Gastrointestinal: Negative for abdominal pain and diarrhea.   Musculoskeletal: Negative for joint pain.   Neurological: Positive for headaches.   Hematological: Positive for adenopathy (neck).   Psychiatric/Behavioral: Negative for dysphoric mood. The patient is nervous/anxious.        Objective:   Physical Exam   Nursing note and vitals reviewed.  Constitutional: Vital signs  are normal. He appears well-developed and well-nourished. He does not have a sickly appearance. He appears ill. No distress.   HENT:   Head: Normocephalic and atraumatic.   Right Ear: Tympanic membrane is not injected. A middle ear effusion is present.   Left Ear: Tympanic membrane is not injected. A middle ear effusion is present.   Nose: Mucosal edema and sinus tenderness present. No rhinorrhea or nose lacerations.   Mouth/Throat: Uvula is midline and mucous membranes are normal. Oropharyngeal exudate (+) PND and posterior oropharyngeal erythema (moderately) present. No posterior oropharyngeal edema.   Cardiovascular: Normal rate, regular rhythm, normal heart sounds and intact distal pulses.    No murmur heard.  Pulmonary/Chest: Effort normal and breath sounds normal. No respiratory distress. He has no decreased breath sounds. He has no wheezes. He has no rhonchi.   Lymphadenopathy:        Head (right side): Submandibular, tonsillar, posterior auricular and occipital adenopathy present.        Head (left side): Submandibular, tonsillar, posterior auricular and occipital adenopathy present.     He has cervical adenopathy.        Right cervical: Superficial cervical adenopathy present.        Left cervical: Superficial cervical adenopathy present.     He has no axillary adenopathy.   Skin: He is not  diaphoretic.       Assessment:      1. Cervical lymphadenopathy  CBC auto differential, methylPREDNISolone (MEDROL, PAK,) 4 MG tablet, MONONUCLEOSIS SCREEN   2. Swallowing pain  lidocaine viscous (XYLOCAINE) 2 % solution   3. Pharyngitis  amoxicillin-clavulanate (AUGMENTIN) 875-125 MG per tablet, methylPREDNISolone (MEDROL, PAK,) 4 MG tablet, MONONUCLEOSIS SCREEN   4. Sinusitis  amoxicillin-clavulanate (AUGMENTIN) 875-125 MG per tablet, methylPREDNISolone (MEDROL, PAK,) 4 MG tablet   5. Cough  benzonatate (TESSALON PERLES) 100 MG capsule              Plan:      Blood drawn in office today- we will call you with the  results  Try to push more fluids  Neti Pot at bedtime and as needed ([employer] or [employer] for the ceramic ones)  Antibiotics until complete (Rx given)  (Augmentin)   Steroids pack as directed (Rx given) - no NSAID's like Ibuprofen/Naprosyn until complete  Rx for cough medicine as directed (Tessalon)  Antihistamine of choice (Claritin, Zyrtec, Allegra) as needed for postnasal dripNasonex/Flonase as needed for ear/nasal congestion (Rx given)  Coricidin HBP as needed for any additional congestion  Tylenol/Ibuprofen as needed for pain / fever  Follow up in 5-7 days if not improving   Lymphadenopathy instructions given

## 2011-12-19 LAB — MONONUCLEOSIS SCREEN: Mono Test: NEGATIVE

## 2011-12-31 NOTE — Addendum Note (Signed)
Addended by: Algis Greenhouse on: 12/31/2011 12:55 PM     Modules accepted: Orders

## 2011-12-31 NOTE — Progress Notes (Signed)
Patient here for annual fasting blood work

## 2012-01-01 LAB — LIPID PANEL
Cholesterol, Total: 163 mg/dL (ref 0–199)
HDL: 63 mg/dL — ABNORMAL HIGH (ref 40–60)
LDL Calculated: 88 mg/dL (ref <100)
Triglycerides: 60 mg/dL (ref 0–150)
VLDL Cholesterol Calculated: 12 mg/dL

## 2012-01-01 LAB — CBC
Hematocrit: 47.4 % (ref 40.5–52.5)
Hemoglobin: 15.8 g/dL (ref 13.5–17.5)
MCH: 33.2 pg (ref 26.0–34.0)
MCHC: 33.3 g/dL (ref 31.0–36.0)
MCV: 99.8 fL (ref 80.0–100.0)
MPV: 10.2 fL (ref 5.0–10.5)
Platelets: 183 10*3/uL (ref 135–450)
RBC: 4.75 M/uL (ref 4.20–5.90)
RDW: 12.1 % — ABNORMAL LOW (ref 12.4–15.4)
WBC: 13.4 10*3/uL — ABNORMAL HIGH (ref 4.0–11.0)

## 2012-01-01 LAB — COMPREHENSIVE METABOLIC PANEL
ALT: 26 U/L (ref 10–40)
AST: 20 U/L (ref 15–37)
Albumin/Globulin Ratio: 2.2 (ref 1.1–2.2)
Albumin: 4.4 g/dL (ref 3.4–5.0)
Alkaline Phosphatase: 65 U/L (ref 40–129)
BUN: 16 mg/dL (ref 7–20)
CO2: 23 mmol/L (ref 21–32)
Calcium: 9 mg/dL (ref 8.3–10.6)
Chloride: 97 mmol/L — ABNORMAL LOW (ref 99–110)
Creatinine: 0.9 mg/dL (ref 0.8–1.3)
GFR African American: >60 (ref >60)
GFR Non-African American: >60 (ref >60)
Globulin: 2 g/dL
Glucose: 270 mg/dL — ABNORMAL HIGH (ref 70–99)
Potassium: 4.5 mmol/L (ref 3.5–5.1)
Sodium: 136 mmol/L (ref 136–145)
Total Bilirubin: 0.5 mg/dL (ref 0.0–1.0)
Total Protein: 6.4 g/dL (ref 6.4–8.2)

## 2012-01-01 LAB — PSA SCREENING: PSA: 0.98 ng/mL (ref 0.00–4.00)

## 2012-01-01 LAB — HEMOGLOBIN A1C
Hemoglobin A1C: 8.7 %
eAG: 203 mg/dL

## 2012-01-01 LAB — TSH: TSH: 1.17 u[IU]/mL (ref 0.27–4.20)

## 2012-01-02 LAB — TESTOSTERONE FREE AND TOTAL MALE
Sex Hormone Binding: 19 nmol/L (ref 11–80)
Testosterone % Free: 2.3 % (ref 1.6–2.9)
Testosterone Free, Calc: 60 pg/mL (ref 47–244)
Total Testosterone: 262 ng/dL — ABNORMAL LOW (ref 300–890)

## 2012-01-02 LAB — VITAMIN D 25 HYDROXY: Vit D, 25-Hydroxy: 27 ng/mL — ABNORMAL LOW (ref 30–80)

## 2012-01-08 MED ORDER — INSULIN LISPRO 100 UNIT/ML SC SOLN
100 UNIT/ML | PEN_INJECTOR | Freq: Three times a day (TID) | SUBCUTANEOUS | Status: DC
Start: 2012-01-08 — End: 2014-11-02

## 2012-01-08 MED ORDER — INSULIN GLARGINE 100 UNIT/ML SC SOLN
100 UNIT/ML | PEN_INJECTOR | Freq: Every evening | SUBCUTANEOUS | Status: DC
Start: 2012-01-08 — End: 2012-07-31

## 2012-01-08 MED ORDER — BLOOD GLUCOSE TEST VI STRP
ORAL_STRIP | Status: DC
Start: 2012-01-08 — End: 2013-04-09

## 2012-01-08 MED ORDER — SIMVASTATIN 40 MG PO TABS
40 MG | ORAL_TABLET | Freq: Every day | ORAL | Status: DC
Start: 2012-01-08 — End: 2012-07-14

## 2012-01-08 MED ORDER — INSULIN PEN NEEDLE 31G X 8 MM MISC
Status: DC
Start: 2012-01-08 — End: 2012-07-31

## 2012-01-08 MED ORDER — LISINOPRIL 10 MG PO TABS
10 MG | ORAL_TABLET | Freq: Every day | ORAL | Status: DC
Start: 2012-01-08 — End: 2012-07-14

## 2012-01-08 MED ORDER — SITAGLIPTIN-METFORMIN HCL 50-500 MG PO TABS
50-500 MG | ORAL_TABLET | Freq: Two times a day (BID) | ORAL | Status: DC
Start: 2012-01-08 — End: 2013-04-09

## 2012-01-08 MED ORDER — LANCETS MISC
Status: DC
Start: 2012-01-08 — End: 2013-04-09

## 2012-01-08 NOTE — Patient Instructions (Addendum)
[facility] for choosing [facility].    Please bring a current list of medications to every appointment.    Please contact your pharmacy for any prescription refill(s) that you are requesting.  I have recommended the following steps for improving diabetic care and outcome to him: diabetic diet discussed in detail, written exchange diet given. A followup visit will be scheduled in the near future to review and reinforce the importance of careful diabetic control to improve long term outcomes.

## 2012-01-08 NOTE — Addendum Note (Signed)
Addended by: Neomia Dear on: 01/08/2012 10:37 AM     Modules accepted: Level of Service

## 2012-01-08 NOTE — Progress Notes (Signed)
Subjective:      Patient ID: Jeremiah Guzman is a 51 y.o. male.    HPI Comments: Patient presents with:    Annual Exam - Yearly check up          Review of Systems   Constitutional: Positive for fatigue.   HENT: Negative for hearing loss, ear pain and sinus pressure.    Eyes: Negative.    Respiratory: Negative.  Negative for apnea, choking and stridor.    Cardiovascular: Negative.    Gastrointestinal: Negative.    Genitourinary: Negative.  Negative for hematuria, discharge and penile swelling.   Musculoskeletal: Negative.  Negative for back pain.   Skin: Negative.  Negative for color change and pallor.   Neurological: Negative for dizziness, tremors, seizures and syncope.   Hematological: Does not bruise/bleed easily.   Psychiatric/Behavioral: Negative for confusion, dysphoric mood and decreased concentration.       Objective:   Physical Exam   Constitutional: He appears well-developed and well-nourished.   HENT:   Mouth/Throat: No oropharyngeal exudate.   Eyes: Conjunctivae and EOM are normal. Pupils are equal, round, and reactive to light. No scleral icterus.   Neck: Normal range of motion. No thyromegaly present.   Cardiovascular: Normal rate, regular rhythm and intact distal pulses.    Pulmonary/Chest: Effort normal. He has no wheezes. He exhibits no tenderness.   Abdominal: Soft. He exhibits no mass. There is no tenderness.   Musculoskeletal: Normal range of motion. He exhibits no edema.   Lymphadenopathy:     He has no cervical adenopathy.   Neurological: He is alert. No cranial nerve deficit.   Skin: Skin is warm.   Psychiatric: His behavior is normal.       Assessment:      Encounter Diagnoses   Name Primary?   ??? DM (diabetes mellitus) Yes   ??? HBP (high blood pressure)    ??? Hyperlipidemia             Plan:      Jeremiah Guzman was seen today for annual exam.    Diagnoses and associated orders for this visit:    DM (diabetes mellitus)  - insulin lispro (HUMALOG PEN) 100 UNIT/ML injection; Inject 10 Units into the skin 3  times daily (before meals).    HBP (high blood pressure)    Hyperlipidemia    Other Orders  - sitaGLIPtan-metformin (JANUMET) 50-500 MG per tablet; Take 1 tablet by mouth 2 times daily (with meals) for 360 days.  - insulin glargine (LANTUS SOLOSTAR) 100 UNIT/ML injection; Inject 80 Units into the skin nightly.  - lisinopril (PRINIVIL;ZESTRIL) 10 MG tablet; Take 1 tablet by mouth daily.  - simvastatin (ZOCOR) 40 MG tablet; Take 1 tablet by mouth daily.  - Insulin Pen Needle (B-D ULTRAFINE III SHORT PEN) 31G X 8 MM MISC; Patient test BID  Dx 250.00  - Lancets MISC; .  - Glucose Blood (BLOOD GLUCOSE TEST STRIPS) STRP; Test daily

## 2012-07-14 MED ORDER — SIMVASTATIN 40 MG PO TABS
40 MG | ORAL_TABLET | Freq: Every day | ORAL | Status: DC
Start: 2012-07-14 — End: 2013-04-09

## 2012-07-14 MED ORDER — LISINOPRIL 10 MG PO TABS
10 MG | ORAL_TABLET | Freq: Every day | ORAL | Status: DC
Start: 2012-07-14 — End: 2013-04-09

## 2012-07-14 NOTE — Telephone Encounter (Signed)
Pt has to catch a flight today at 1130. He is out of his simvastatin and lisinopril. He uses [facility] normally but has not received it yet through the mail. He only has 2 pills left. Needed refill sent to refill [facility] [place].

## 2012-07-29 NOTE — Progress Notes (Signed)
Patient here for fasting blood work

## 2012-07-30 LAB — HEMOGLOBIN A1C
Hemoglobin A1C: 7.2 %
eAG: 159.9 mg/dL

## 2012-07-31 LAB — COMPREHENSIVE METABOLIC PANEL
ALT: 27 U/L (ref 10–40)
AST: 20 U/L (ref 15–37)
Albumin/Globulin Ratio: 2.3 — ABNORMAL HIGH (ref 1.1–2.2)
Albumin: 4.5 g/dL (ref 3.4–5.0)
Alkaline Phosphatase: 61 U/L (ref 40–129)
BUN: 16 mg/dL (ref 7–20)
CO2: 28 mmol/L (ref 21–32)
Calcium: 9.5 mg/dL (ref 8.3–10.6)
Chloride: 99 mmol/L (ref 99–110)
Creatinine: 1 mg/dL (ref 0.9–1.3)
GFR African American: >60 (ref >60)
GFR Non-African American: >60 (ref >60)
Globulin: 2 g/dL
Glucose: 206 mg/dL — ABNORMAL HIGH (ref 70–99)
Potassium: 4.1 mmol/L (ref 3.5–5.1)
Sodium: 139 mmol/L (ref 136–145)
Total Bilirubin: 0.4 mg/dL (ref 0.0–1.0)
Total Protein: 6.5 g/dL (ref 6.4–8.2)

## 2012-07-31 LAB — LIPID PANEL
Cholesterol, Total: 152 mg/dL (ref 0–199)
HDL: 56 mg/dL (ref 40–60)
LDL Calculated: 79 mg/dL (ref <100)
Triglycerides: 87 mg/dL (ref 0–150)
VLDL Cholesterol Calculated: 17 mg/dL

## 2012-07-31 LAB — VITAMIN D 25 HYDROXY: Vit D, 25-Hydroxy: 41 ng/mL (ref 30–80)

## 2012-07-31 MED ORDER — SILDENAFIL CITRATE 100 MG PO TABS
100 | ORAL_TABLET | ORAL | Status: DC | PRN
Start: 2012-07-31 — End: 2012-08-18

## 2012-07-31 MED ORDER — LANCETS MISC
Status: DC
Start: 2012-07-31 — End: 2013-04-09

## 2012-07-31 MED ORDER — BLOOD GLUCOSE TEST VI STRP
ORAL_STRIP | Status: DC
Start: 2012-07-31 — End: 2012-09-09

## 2012-07-31 MED ORDER — INSULIN PEN NEEDLE 31G X 8 MM MISC
Status: DC
Start: 2012-07-31 — End: 2013-04-09

## 2012-07-31 NOTE — Progress Notes (Signed)
Subjective:      Patient ID: Jeremiah Guzman is a 52 y.o. male.    Diabetes  He presents for his follow-up diabetic visit. He has type 2 diabetes mellitus. The initial diagnosis of diabetes was made 15 years ago. His disease course has been fluctuating. Hypoglycemia symptoms include dizziness. Pertinent negatives for hypoglycemia include no confusion, pallor, seizures or tremors. There are no diabetic associated symptoms. Pertinent negatives for diabetes include no fatigue. There are no hypoglycemic complications. Symptoms are stable. There are no diabetic complications. Current diabetic treatment includes oral agent (dual therapy) and insulin injections. He is compliant with treatment all of the time. His weight is stable. He is following a generally healthy diet. He participates in exercise daily. An ACE inhibitor/angiotensin II receptor blocker is being taken. Eye exam is current.       Review of Systems   Constitutional: Negative.  Negative for fatigue.   HENT: Negative for hearing loss, ear pain and sinus pressure.    Eyes: Negative.  Negative for visual disturbance.   Respiratory: Negative.  Negative for apnea, choking and stridor.    Cardiovascular: Negative for leg swelling.   Gastrointestinal: Negative.    Genitourinary: Negative.  Negative for hematuria, discharge, penile swelling and penile pain.   Musculoskeletal: Negative.  Negative for back pain.   Skin: Negative for color change and pallor.   Neurological: Positive for dizziness. Negative for tremors, seizures and syncope.   Hematological: Does not bruise/bleed easily.   Psychiatric/Behavioral: Negative for confusion, dysphoric mood and decreased concentration.       Objective:   Physical Exam   Constitutional: He appears well-developed and well-nourished.   HENT:   Mouth/Throat: No oropharyngeal exudate.   Eyes: Conjunctivae and EOM are normal. Pupils are equal, round, and reactive to light. No scleral icterus.   Neck: Normal range of motion. No  thyromegaly present.   Cardiovascular: Normal rate, regular rhythm and intact distal pulses.    Pulmonary/Chest: Effort normal. He has no wheezes. He exhibits no tenderness.   Abdominal: Soft. He exhibits no mass. There is no tenderness.   Musculoskeletal: Normal range of motion. He exhibits no edema.   Lymphadenopathy:     He has no cervical adenopathy.   Neurological: He is alert. No cranial nerve deficit.   Skin: Skin is warm.   Psychiatric: His behavior is normal.       Assessment:      Encounter Diagnoses   Name Primary?   ??? DM (diabetes mellitus) (HCC) Yes   ??? ED (erectile dysfunction)    ??? Hyperlipidemia    ??? HBP (high blood pressure)             Plan:      Jeremiah Guzman was seen today for diabetes.    Diagnoses and associated orders for this visit:    DM (diabetes mellitus) (HCC)  - Lancets MISC; Test tid  - Glucose Blood (BLOOD GLUCOSE TEST STRIPS) STRP; Test tid with glucometer of one touch Verio IQ.    ED (erectile dysfunction)  - sildenafil (VIAGRA) 100 MG tablet; Take 1 tablet by mouth as needed for Erectile Dysfunction for up to 18 days.    Hyperlipidemia    HBP (high blood pressure)

## 2012-07-31 NOTE — Progress Notes (Signed)
BP Readings from Last 2 Encounters:   07/31/12 138/60   01/08/12 128/74     A1c (no units)   Date Value   07/29/2009 8.4*        Hemoglobin A1C (%)   Date Value   07/29/2012 7.2         MICROALBUMIN, RANDOM URINE (mg/dL)   Date Value   16/01/958 0.89         LDL Calculated (mg/dL)   Date Value   4/54/0981 79               Tobacco use:  Patient  reports that he has never smoked. He quit smokeless tobacco use about 2 years ago.    If Smoker - Cessation materials given? NA    Is Daily aspirin on medication list?:  Yes    Diabetic retinal exam done? No   If yes, document in Health Maintenance.     Monofilament placed on counter? Yes    Shoes and socks removed? Yes    BP taken with correct size cuff? Yes   Repeated if > 130/80 Yes     Microalbumin performed if applicable?  NA    Good sensation in both feet.

## 2012-09-09 ENCOUNTER — Telehealth

## 2012-09-09 MED ORDER — BLOOD GLUCOSE TEST VI STRP
ORAL_STRIP | Status: DC
Start: 2012-09-09 — End: 2013-04-09

## 2012-09-09 NOTE — Telephone Encounter (Signed)
Jeremiah Guzman is requesting One Touch Verio Test Strips sent to [facility] instead of [facility].

## 2012-11-20 NOTE — Patient Instructions (Signed)
After obtaining informed consent, the immunization is given by Becky P.        Please call your pharmacy if you need any refills of your medication(s).    Please call our office at [phone] if you don't hear from us about your test results.                                                                 Bring an accurate list of your medications with you at every appointment to ensure that we have the correct information.    Our office hours are: Monday - Friday 8:30 am- 5 pm; Saturdays vary on doctor working.    Phone lines turn on at 8:30 am.

## 2012-12-01 MED ORDER — AZITHROMYCIN 250 MG PO TABS
250 MG | ORAL_TABLET | ORAL | Status: DC
Start: 2012-12-01 — End: 2012-12-01

## 2012-12-01 MED ORDER — AZITHROMYCIN 250 MG PO TABS
250 MG | ORAL_TABLET | ORAL | Status: AC
Start: 2012-12-01 — End: 2012-12-11

## 2012-12-01 MED ORDER — GUAIFENESIN-CODEINE 100-10 MG/5ML PO SYRP
100-105 MG/5ML | Freq: Every evening | ORAL | Status: DC | PRN
Start: 2012-12-01 — End: 2012-12-01

## 2012-12-01 NOTE — Progress Notes (Signed)
Subjective:      Patient ID: Jeremiah Guzman is a 52 y.o. male.    HPI Comments: Patient presents with:  Chest Congestion: pt has chest congestion and has productive cought x 5 days          Review of Systems   Constitutional: Positive for fatigue.   HENT: Positive for sore throat. Negative for ear pain, hearing loss and sinus pressure.    Eyes: Positive for pain.   Respiratory: Positive for cough and shortness of breath. Negative for apnea, choking, wheezing and stridor.         Productive cough for 5 days.   Cardiovascular: Negative.    Gastrointestinal: Negative.    Genitourinary: Negative.  Negative for hematuria, discharge and penile swelling.   Musculoskeletal: Negative.  Negative for back pain.   Skin: Negative for color change and pallor.   Neurological: Positive for headaches. Negative for dizziness, tremors, seizures and syncope.   Hematological: Does not bruise/bleed easily.   Psychiatric/Behavioral: Negative.  Negative for confusion, dysphoric mood and decreased concentration.       Objective:   Physical Exam   Constitutional: He appears well-developed and well-nourished.   HENT:   Mouth/Throat: No oropharyngeal exudate.   Eyes: Conjunctivae and EOM are normal. Pupils are equal, round, and reactive to light. No scleral icterus.   Neck: Normal range of motion. No thyromegaly present.   Cardiovascular: Normal rate, regular rhythm and intact distal pulses.    Pulmonary/Chest: Effort normal. He has no wheezes. He exhibits no tenderness.   Abdominal: Soft. He exhibits no mass. There is no tenderness.   Musculoskeletal: Normal range of motion. He exhibits no edema.   Lymphadenopathy:     He has no cervical adenopathy.   Neurological: He is alert. No cranial nerve deficit.   Skin: Skin is warm.   Psychiatric: His behavior is normal.       Assessment:      Encounter Diagnosis   Name Primary?   ??? URI (upper respiratory infection) Yes            Plan:      Jeremiah Guzman was seen today for chest congestion.    Diagnoses and  associated orders for this visit:    URI (upper respiratory infection)  - azithromycin (ZITHROMAX Z-PAK) 250 MG tablet; As directed  - guaiFENesin-codeine (GUIATUSS AC) 100-10 MG/5ML syrup; Take 10 mLs by mouth nightly as needed for Cough for up to 7 days.

## 2012-12-01 NOTE — Patient Instructions (Signed)
Please call your pharmacy if you need any refills of your medication(s).    Please call our office at [phone] if you don't hear from us about your test results.                                                                 Bring an accurate list of your medications with you at every appointment to ensure that we have the correct information.    Our office hours are: Monday - Friday 8:30 am- 5 pm; Saturdays vary on doctor working.    Phone lines turn on at 8:30 am.

## 2012-12-01 NOTE — Addendum Note (Signed)
Addended byAnselm Pancoast on: 12/01/2012 02:49 PM     Modules accepted: Orders, Medications

## 2013-02-24 NOTE — ED Notes (Signed)
Dr.Givens into eval pt    Jeremiah KuLisa M Emmarose Klinke, RN  02/24/13 2046

## 2013-02-24 NOTE — ED Notes (Signed)
Pt not tolerating the carbonated soda, pt instructed to not drink anymore.    Sharrell KuLisa M Curlie Macken, RN  02/24/13 2100

## 2013-02-24 NOTE — ED Provider Notes (Signed)
Jeremiah Guzman is a 53 y.o. male who presents with a complaint of   Chief Complaint   Patient presents with   ??? Swallowed Foreign Body     pt states he has a piece of chicken stuck in esophagus, states this happens about once a week, this time pt feels he is unable to swallow salive     [facility] swallowing a chicken bone and having an impacted in his esophagus for an upper endoscopy by Dr. Paulina FusiHess    On my exam:  Vital Signs: BP 142/67    Pulse 79    Temp(Src) 98.1 ??F (36.7 ??C) (Oral)    Resp 15    Ht 5\' 11"  (1.803 m)    Wt 240 lb (108.863 kg)    BMI 33.49 kg/m2      SpO2 98%   General: Patient appears non-toxic  I evalauted the patient after he underwent his endoscopy by Dr. Paulina FusiHess.  I spoke with the patient and Dr. Paulina FusiHess in the ed. Dr. Paulina FusiHess has said that he had a lot of ulcerations in his esophagus the chicken bone already went down. He wanted the patient to be discharged home on Prilosec and follow-up with him for evaluation and I esophageal dilation in the future. Patient did well in the emergency department. On reevaluation prior to discharge after being observed for a prolonged period of time due to his sedation for the endoscopy he was doing well he was ambulating with a steady stable gait he was not in any distress he was able to swallow liquids and solids without a problem he had no chest discomfort had clear breath sounds bilaterally and had soft nontender abdomen and unremrcabel cardiac exam. Patient discharged home to follow for revaluation with Dr. Paulina FusiHess within 72 hours and his primary care physician within 48 hours. Patient advised to return to the emergent immediately developed chest pain abdominal pain problems swallowing has fevers or shaking chills difficulty breathing black or red stools recurrent vomiting here in general feels worse. Patient was agreeable with this plan.  12:36 AM  Discussed results, diagnosis and plan with patient and/or family.   Questions addressed.  Disposition and follow-up agreed upon.  Specific discharge instructions explained, including reasons to return to the emergency department.          This patient was seen by the mid-level provider.  I have seen and examined the patient face to face, agree with the workup, evaluation, management and diagnosis.  Care plan has been discussed.      Electronically signed by: Modesta MessingEdward J Dhanush Jokerst, MD, 02/25/2013  12:36 AM        Modesta MessingEdward J Leathie Weich, MD  02/25/13 [phone]

## 2013-02-24 NOTE — ED Notes (Signed)
[phone] called to page GI on call    Forest Becker, RN  02/24/13 2211

## 2013-02-24 NOTE — H&P (Signed)
[facility]   Pre-operative History and Physical    Patient: Jeremiah Guzman  DOB: February 23, 1960  Acct#:     History Obtained From: electronic medical record    HISTORY OF PRESENT ILLNESS  Procedure:EGD  Indications:foreign body in esophagus  Past Medical History:        Diagnosis Date   ??? Obesity 03/27/2008   ??? Hyperlipidemia 03/27/2008   ??? HBP (high blood pressure) 03/27/2008   ??? Diabetes type 1, uncontrolled (HCC)    ??? Erectile dysfunction    ??? Cancer of skin      Past Surgical History:        Procedure Laterality Date   ??? Tonsillectomy     ??? Colonoscopy  01-06-2007     neg Dr. Alfonse AlpersAlex Saba     Medications Prior to Admission:   No current facility-administered medications on file prior to encounter.     Current Outpatient Prescriptions on File Prior to Encounter   Medication Sig Dispense Refill   ??? Glucose Blood (BLOOD GLUCOSE TEST STRIPS) STRP Test tid with glucometer of one touch Verio IQ.  300 strip  3   ??? insulin glargine (LANTUS SOLOSTAR) 100 UNIT/ML injection pen Inject 65 Units into the skin nightly.       ??? Lancets MISC Test tid  300 each  3   ??? sildenafil (VIAGRA) 100 MG tablet Take 1 tablet by mouth as needed for Erectile Dysfunction for up to 18 days.  13 tablet  3   ??? Insulin Pen Needle (B-D ULTRAFINE III SHORT PEN) 31G X 8 MM MISC Patient test BID  Dx 250.00  300 each  3   ??? lisinopril (PRINIVIL;ZESTRIL) 10 MG tablet Take 1 tablet by mouth daily.  30 tablet  0   ??? simvastatin (ZOCOR) 40 MG tablet Take 1 tablet by mouth daily.  30 tablet  0   ??? sitaGLIPtan-metformin (JANUMET) 50-500 MG per tablet Take 1 tablet by mouth 2 times daily (with meals) for 360 days.  180 tablet  3   ??? insulin lispro (HUMALOG PEN) 100 UNIT/ML injection Inject 10 Units into the skin 3 times daily (before meals).  9 Pen  3   ??? Lancets MISC .  300 each  3   ??? Glucose Blood (BLOOD GLUCOSE TEST STRIPS) STRP Test daily  300 strip  3   ??? Multiple Vitamin TABS Take  by mouth daily.       ??? aspirin 81 MG tablet Take 81 mg by mouth daily.        ??? sildenafil (VIAGRA) 100 MG tablet Take 1 tablet by mouth as needed for Erectile Dysfunction.  10 tablet  5     Allergies:  Review of patient's allergies indicates no known allergies.  History     Social History   ??? Marital Status: Married     Spouse Name: N/A     Number of Children: N/A   ??? Years of Education: N/A     Occupational History   ??? Not on file.     Social History Main Topics   ??? Smoking status: Never Smoker    ??? Smokeless tobacco: Former NeurosurgeonUser     Quit date: 01/25/2010   ??? Alcohol Use: 3.6 oz/week     6 Cans of beer per week      Comment: weekends   ??? Drug Use: No   ??? Sexual Activity:     Partners: Female     Other Topics Concern   ???  Not on file     Social History Narrative     Family History   Problem Relation Age of Onset   ??? Cancer Mother      colon   ??? Cancer Father      skin   ??? Diabetes Paternal Grandmother    ??? Heart Attack Maternal Grandfather 40   ??? Asthma Sister          PHYSICAL EXAM:      BP 123/70    Pulse 87    Temp(Src) 98.1 ??F (36.7 ??C) (Oral)    Resp 11    Ht 5\' 11"  (1.803 m)    Wt 240 lb (108.863 kg)    BMI 33.49 kg/m2      SpO2 95%  I        Heart:normal    Lungs: normal    Abdomen: normal      ASA Grade:  ASA 2 - Patient with mild systemic disease with no functional limitations    III (soft palate, base of uvula visible)  ASSESSMENT AND PLAN:    1.  Procedure options, risks and benefits reviewed with patient and expresses understanding.

## 2013-02-24 NOTE — ED Notes (Signed)
Pt ambulated into ED with c/o a piece of chicken stuck in his throat. Pt states this happens about once a week, but not this bad. Pt is unable to swallow water or his saliva. Denies [place], RN  02/24/13 2031

## 2013-02-24 NOTE — Discharge Instructions (Signed)
Swallowed Object in Throat or Esophagus: After Your Visit  Your Care Instructions  When you swallow food, liquid, or an object, it passes from your mouth and goes down your throat and esophagus and into your stomach. But sometimes these things can get stuck in your throat or esophagus. This may make you choke, cough, or gag. Some objects can cause more problems than others. Sharp, long, or large objects can scratch or cut your throat, your esophagus, and your stomach if they get stuck or if they are swallowed. When this happens, these areas can bleed or get infected.  If the object was stuck in your throat or esophagus, your doctor probably removed it. If you swallowed the object, your doctor may have suggested that you wait and see if the object comes out in your stool. Most swallowed objects will pass through your body without any problem and show up in your stool within 3 days. If the object does not show up in your stool within 7 days, your doctor may order tests to find out where it is in your body.  Your throat may feel sore after you have had an object removed or have swallowed an object that has scratched your throat. It may hurt for a few days when you eat or swallow. The scratch itself may make it feel as if something is still stuck in your throat.  Follow-up care is a key part of your treatment and safety. Be sure to make and go to all appointments, and call your doctor if you are having problems. It is also a good idea to know your test results and keep a list of the medicines you take.  How can you care for yourself at home?   Take pain medicines exactly as directed.   If the doctor gave you a prescription medicine for pain, take it as prescribed.   If you are not taking a prescription pain medicine, ask your doctor if you can take an over-the-counter medicine.   If your doctor prescribed antibiotics, take them as directed. Do not stop taking them just because you feel better. You need to take the  full course of antibiotics.   Drink liquids. If swallowing liquids is easy, try eating soft foods like bread or bananas. If these foods are easy to swallow, start to add other foods.   Avoid very hot or very cold foods.   If you swallowed an object and it has passed through to your stomach, try eating foods that are high in fiber, such as fruits, vegetables, and whole grains. These foods may help you pass the object more quickly.   Watch your stools to see if the object has passed. Do not use a laxative unless your doctor says that it is okay.   Do not smoke. Smoking can irritate your throat and your esophagus even more. If you need help quitting, talk to your doctor about stop-smoking programs and medicines. These can increase your chances of quitting for good.  To prevent swallowing objects or choking:   Cut food into small pieces.   Eat slowly, take small bites, and chew your food all the way.   Do not laugh or talk with food in your mouth.   Do not eat or drink while you are doing something else, such as when you drive.   Do not hold objects, such as pins, nails, or toothpicks, in your mouth or between your lips.   Limit how much alcohol you drink while   you eat.  When should you call for help?  Call 911 anytime you think you may need emergency care. For example, call if:   You have chest pain.   You vomit a large amount of blood or what looks like coffee grounds.   You have severe stomach pain.   You pass maroon or very bloody stools.   You can't swallow.   You have severe trouble breathing.  Call your doctor now or seek immediate medical care if:   You have any stomach pain.   You have signs of an infection, such as:   Pain, swelling, or tenderness in or around your throat, neck, chest, or belly.   A fever.   A cough.   Shortness of breath.   You vomit a small amount of blood or what looks like coffee grounds.   You have trouble breathing.   You have trouble swallowing.   You vomited  more than one time since you had an object removed from your throat or esophagus or since you swallowed an object.   Your stools are black and tarlike or have streaks of blood.  Watch closely for changes in your health, and be sure to contact your doctor if:   You still feel like you have something stuck in your throat or esophagus.   You do not get better as expected.   Where can you learn more?   Go to [email or web address] and sign in to your MyChart account. Enter [phone] in the [place] box to learn more about "Swallowed Object in Throat or Esophagus: After Your Visit."    If you do not have an account, please click on the "Sign Up Now" link.      2006-2014 Health[employer], [employer]. Care instructions adapted under license by [facility]. This care instruction is for use with your licensed healthcare professional. If you have questions about a medical condition or this instruction, always ask your healthcare professional. [place] any warranty or liability for your use of this information.  Content Version: 10.3.368381; Current as of: June 11, 2012                      Sedation for a Medical Procedure: After Your Visit  Your Care Instructions  For a minor procedure or surgery, you will get a sedative to help you relax. This drug will make you sleepy. It is usually given in a vein (by IV). A shot may also be used to numb the area.  If you had local anesthesia, you may feel some pain and discomfort as it wears off. If you have pain, don't be afraid to say so. Pain medicine works better if you take it before the pain gets bad.  Common side effects from sedation include:   Feeling sleepy. (Your doctors and nurses will make sure you are not too sleepy to go home.)   Nausea and vomiting. This usually does not last long.   Feeling tired.  Follow-up care is a key part of your treatment and safety. Be sure to make and go to all appointments, and call your  doctor if you are having problems. It's also a good idea to know your test results and keep a list of the medicines you take.  How can you care for yourself at home?  Activity   Don't do anything for 24 hours that requires attention to detail. It takes time for the medicine effects to completely wear off.  For your safety, you should not drive or operate any machinery that could be dangerous until the medicine wears off and you can think clearly and react easily.   Rest when you feel tired. Getting enough sleep will help you recover.  Diet   You can eat your normal diet, unless your doctor gives you other instructions. If your stomach is upset, try clear liquids and bland, low-fat foods like plain toast or rice.   Drink plenty of fluids (unless your doctor tells you not to).   Don't drink alcohol for 24 hours.  Medicines   Be safe with medicines. Read and follow all instructions on the label.   If the doctor gave you a prescription medicine for pain, take it as prescribed.   If you are not taking a prescription pain medicine, ask your doctor if you can take an over-the-counter medicine.   If you think your pain medicine is making you sick to your stomach:   Take your medicine after meals (unless your doctor has told you not to).   Ask your doctor for a different pain medicine.  When should you call for help?  Call 911 anytime you think you may need emergency care. For example, call if:   You have severe trouble breathing.   You passed out (lost consciousness).  Call your doctor now or seek immediate medical care if:   You have trouble breathing.   You have ongoing or worsening nausea or vomiting.   You have a fever.   You have a new or worse headache.   The medicine is not wearing off and you can't think clearly.  Watch closely for changes in your health, and be sure to contact your doctor if:   You do not get better as expected.   Where can you learn more?   Go to  [email or web address] and sign in to your MyChart account. Enter [phone] in the [place] box to learn more about "Sedation for a Medical Procedure: After Your Visit."    If you do not have an account, please click on the "Sign Up Now" link.      2006-2014 Healthwise, Incorporated. Care instructions adapted under license by [facility]. This care instruction is for use with your licensed healthcare professional. If you have questions about a medical condition or this instruction, always ask your healthcare professional. [place] any warranty or liability for your use of this information.  Content Version: 10.3.368381; Current as of: September 16, 2012                  Upper GI Endoscopy: What to Expect at [place]  After you have an endoscopy, you will stay at the hospital or clinic for 1 to 2 hours. This will allow the medicine to wear off. You will be able to go home after your doctor or nurse checks to make sure you are not having any problems.  You may have to stay overnight if you had treatment during the test. You may have a sore throat for a day or two after the test.  This care sheet gives you a general idea about what to expect after the test.  How can you care for yourself at home?  Activity   Rest as much as you need to after you go home.   You should be able to go back to your usual activities the day after the test.  Diet   Follow your doctor's  directions for eating after the test.   Drink plenty of fluids (unless your doctor has told you not to).  Medications   If you have a sore throat the day after the test, use an over-the-counter spray to numb your throat.  Follow-up care is a key part of your treatment and safety. Be sure to make and go to all appointments, and call your doctor if you are having problems. It's also a good idea to know your test results and keep a list of the medicines you take.  When should you call for  help?  Call 911 anytime you think you may need emergency care. For example, call if:   You passed out (lost consciousness).   You cough up blood.   You vomit blood or what looks like coffee grounds.   You pass maroon or very bloody stools.  Call your doctor now or seek immediate medical care if:   You have trouble swallowing.   You have belly pain.   Your stools are black and tarlike or have streaks of blood.   You are sick to your stomach or cannot keep fluids down.  Watch closely for changes in your health, and be sure to contact your doctor if:   Your throat still hurts after a day or two.   You do not get better as expected.   Where can you learn more?   Go to [email or web address] and sign in to your MyChart account. Enter [phone] in the [place] box to learn more about "Upper GI Endoscopy: What to Expect at Home."    If you do not have an account, please click on the "Sign Up Now" link.      2006-2014 Healthwise, Incorporated. Care instructions adapted under license by [facility]. This care instruction is for use with your licensed healthcare professional. If you have questions about a medical condition or this instruction, always ask your healthcare professional. [place] any warranty or liability for your use of this information.  Content Version: 10.3.368381; Current as of: February 28, 2011

## 2013-02-24 NOTE — ED Notes (Signed)
Pt continued to drink soda and continued to vomit it back up, reinforced to pt by cont to drink soda will cont to cause emesis. Pt verbalized understanding to not drink it anymore.    Forest Becker, RN  02/24/13 2138

## 2013-02-24 NOTE — Procedures (Signed)
[facility]  Endoscopy Note    Patient: Jeremiah Guzman  DOB: [birth year]  Acct#:     Procedure: Esophagogastroduodenoscopy with foreign body    Date:  02/24/2013     Surgeon:  Orvan Seen, MD    Referring Physician:  Maudie Mercury    Preoperative Diagnosis:  Foreign body in esophagus    Postoperative Diagnosis:  same    Anesthesia:  Versed 5 mg IV, fentanyl 100 mcg IV    Indications: This is a 53 y.o. year old male who presents today with Removal of foreign body.      Description of Procedure:  Informed consent was obtained from the patient after explanation of indications, benefits and possible risks and complications of the procedure.  The patient was then taken to the endoscopy suite, placed in the left lateral decubitus position and the above IV sedation was administrered.    The Olympus videoendoscope was placed in the patient's mouth and under direct visualization passed into the esophagus.  Visualization of the esophagus demonstrated ulceration at the EGJ. A large piece of chicken was passed into the stomach. The distal esophagus is ulcerated. It was not dilated. The chicken was out of the esophagus and passed into the stomach..     The scope was then advanced into the stomach.    Visualization of the gastric body and antrum demonstrated gastritis. Chicken bolus was passed into the stomach..  A retroflexed exam of the gastric cardia and fundus demonstrated normal..  The pylorus was patent and the scope was advanced into the duodenum.  Visualization of the duodenal bulb demonstrated duodenitis..  The second portion of the duodenum demonstrated normal..    The scope was then withdrawn back into the stomach, it was decompressed, and the scope was completely withdrawn.    The patient tolerated the procedure well and was taken to the post anesthesia care unit in good condition.    Impression: Chicken bolus passed into the stomach with severe ulcerated esophagitis. He has severe duodenitis as  well.      Recommendations:Omeprazole. Return for EGD and dilation with Dr. Awanda Mink    Fey Coghill Gasper Lloyd, MD  [facility]

## 2013-02-24 NOTE — ED Notes (Signed)
Dr.Givens at bedside, pt cont to have no relief of symptoms.    Sharrell KuLisa M Irina Okelly, RN  02/24/13 [phone]

## 2013-02-24 NOTE — ED Provider Notes (Signed)
[facility] ED  eMERGENCY dEPARTMENT eNCOUnter      Pt Name: Jeremiah Guzman  MRN: [id number]  Birthdate Nov 30, 1960  Date of evaluation: 02/24/2013  Provider: Bobette Mo, MD    CHIEF COMPLAINT       Chief Complaint   Patient presents with   ??? Swallowed Foreign Body     pt states he has a piece of chicken stuck in esophagus, states this happens about once a week, this time pt feels he is unable to swallow salive       HISTORY OF PRESENT ILLNESS  (Location/Symptom, Timing/Onset, Context/Setting, Quality, Duration, Modifying Factors, Severity.)   Jeremiah Guzman is a 53 y.o. male who presents to the emergency department complaining of a piece of chicken stuck in his esophagus.  States he was eating lunch around 2 PM which included a piece of cooked chicken breast without any bones that he swallowed but states it feels like it got stuck in his lower esophagus.  He has tried drinking water but continues to vomit this up.  He is able to swallow his saliva but that he will eventually regurgitate this as well within a few minutes.  He has not vomited up any bile or food particles.  He complains of a spasm and cramping type discomfort in his lower chest.  States he has had similar episodes to this in the past, occurring sometimes as often as once a week.  Episodes always occur with meat such as chicken or steak.  He's never had any difficulty with liquids or soft foods.  He states usually the food bolus will pass on its own or he is able to regurgitate it back up.  He's never had to seek medical care for these episodes and has never had an EGD or further evaluation by a gastroenterologist for this problem.  Denies any other complaints.  No choking, coughing or shortness of breath.  He is not nauseous.  No lower abdominal pain, diarrhea or constipation.  No other recent illness.      Nursing Notes were reviewed and I agree.    REVIEW OF SYSTEMS    (2-9 systems for level 4, 10 or more for level 5)     Review of Systems    Constitutional: Negative for fever and chills.   Respiratory: Negative for cough, choking and shortness of breath.    Cardiovascular: Negative for chest pain.   Gastrointestinal: Negative for nausea, vomiting, abdominal pain, diarrhea and constipation.        Per HPI   Genitourinary: Negative for dysuria, urgency and frequency.       Except as noted above the remainder of the review of systems was reviewed and negative.       PAST MEDICAL HISTORY     Past Medical History   Diagnosis Date   ??? Obesity 03/27/2008   ??? Hyperlipidemia 03/27/2008   ??? HBP (high blood pressure) 03/27/2008   ??? Diabetes type 1, uncontrolled (HCC)    ??? Erectile dysfunction    ??? Cancer of skin          SURGICAL HISTORY       Past Surgical History   Procedure Laterality Date   ??? Tonsillectomy     ??? Colonoscopy  01-06-2007     neg Dr. Alfonse Alpers         CURRENT MEDICATIONS       Previous Medications    ASPIRIN 81 MG TABLET    Take 81  mg by mouth daily.    GLUCOSE BLOOD (BLOOD GLUCOSE TEST STRIPS) STRP    Test daily    GLUCOSE BLOOD (BLOOD GLUCOSE TEST STRIPS) STRP    Test tid with glucometer of one touch Verio IQ.    INSULIN GLARGINE (LANTUS SOLOSTAR) 100 UNIT/ML INJECTION PEN    Inject 65 Units into the skin nightly.    INSULIN LISPRO (HUMALOG PEN) 100 UNIT/ML INJECTION    Inject 10 Units into the skin 3 times daily (before meals).    INSULIN PEN NEEDLE (B-D ULTRAFINE III SHORT PEN) 31G X 8 MM MISC    Patient test BID  Dx 250.00    LANCETS MISC    .    LANCETS MISC    Test tid    LISINOPRIL (PRINIVIL;ZESTRIL) 10 MG TABLET    Take 1 tablet by mouth daily.    MULTIPLE VITAMIN TABS    Take  by mouth daily.    SILDENAFIL (VIAGRA) 100 MG TABLET    Take 1 tablet by mouth as needed for Erectile Dysfunction.    SILDENAFIL (VIAGRA) 100 MG TABLET    Take 1 tablet by mouth as needed for Erectile Dysfunction for up to 18 days.    SIMVASTATIN (ZOCOR) 40 MG TABLET    Take 1 tablet by mouth daily.    SITAGLIPTAN-METFORMIN (JANUMET) 50-500 MG PER TABLET    Take 1  tablet by mouth 2 times daily (with meals) for 360 days.       ALLERGIES     Review of patient's allergies indicates no known allergies.    FAMILY HISTORY       Family History   Problem Relation Age of Onset   ??? Cancer Mother      colon   ??? Cancer Father      skin   ??? Diabetes Paternal Grandmother    ??? Heart Attack Maternal Grandfather 40   ??? Asthma Sister           SOCIAL HISTORY       History     Social History   ??? Marital Status: Married     Spouse Name: N/A     Number of Children: N/A   ??? Years of Education: N/A     Social History Main Topics   ??? Smoking status: Never Smoker    ??? Smokeless tobacco: Former Neurosurgeon     Quit date: 01/25/2010   ??? Alcohol Use: 3.6 oz/week     6 Cans of beer per week      Comment: weekends   ??? Drug Use: No   ??? Sexual Activity:     Partners: Female     Other Topics Concern   ??? None     Social History Narrative         PHYSICAL EXAM    (up to 7 for level 4, 8 or more for level 5)   ED Triage Vitals   BP Temp Temp Source Pulse Resp SpO2 Height Weight   02/24/13 2014 02/24/13 2014 02/24/13 2014 02/24/13 2014 02/24/13 2014 02/24/13 2014 02/24/13 2014 02/24/13 2014   132/90 mmHg 98.1 ??F (36.7 ??C) Oral 96 18 99 % 5\' 11"  (1.803 m) 240 lb (108.863 kg)       Physical Exam   Constitutional: He is oriented to person, place, and time. He appears well-developed and well-nourished.   HENT:   Head: Normocephalic and atraumatic.   Mouth/Throat: Oropharynx is clear and moist.   Eyes: Pupils are  equal, round, and reactive to light.   Neck: Normal range of motion. Neck supple.   Cardiovascular: Normal rate, regular rhythm, normal heart sounds and intact distal pulses.    Pulmonary/Chest: Effort normal and breath sounds normal. No respiratory distress. He has no wheezes. He has no rales. He exhibits no tenderness.   Abdominal: Soft. Bowel sounds are normal. He exhibits mass. He exhibits no distension. There is no tenderness.   Neurological: He is alert and oriented to person, place, and time.   Skin: Skin is  warm and dry.   Psychiatric: He has a normal mood and affect.           DIFFERENTIAL DIAGNOSIS   Esophageal food impaction, esophageal stricture or diverticulum      DIAGNOSTIC RESULTS     EKG: All EKG's are interpreted by Bobette Mo, MD in the absence of a cardiologist.    N/A    RADIOLOGY:   Non-plain film images such as CT, Ultrasound and MRI are read by the radiologist. Plain radiographic images are visualized and preliminarily interpreted byTracy Tomasita Crumble, MD with the below findings:    N/A    Interpretation per the Radiologist below, if available at the time of this note:           ED BEDSIDE ULTRASOUND:   Performed by ED Physician - none    LABS:  Labs Reviewed - No data to display    All other labs were within normal range or not returned as of this dictation.    EMERGENCY DEPARTMENT COURSE and DIFFERENTIAL DIAGNOSIS/MDM:   Vitals:    Filed Vitals:    02/24/13 2014 02/24/13 2120   BP: 132/90 166/96   Pulse: 96 90   Temp: 98.1 ??F (36.7 ??C)    TempSrc: Oral    Resp: 18 18   Height: 5\' 11"  (1.803 m)    Weight: 240 lb (108.863 kg)    SpO2: 99% 98%       Patient presents with report of a piece of chicken stuck in his lower esophagus.  He is able to swallow secretions and water but immediately regurgitate these.  He's had nothing pass up or down past the obstruction since 2 PM.  He was given a carbonated beverage to drink in the ED as well as an IV dose of glucagon with no improvement.  Given the duration of his impaction there is concern for further complications if intervention is not performed.  I spoke to Dr. Paulina Fusi, gastroenterologist on call at [facility], who agreed to see the patient at [facility] ED for upper endoscopy, foreign body removal and further evaluation or treatment as needed.  I also spoke to Dr. Lowella Curb in the ED who will be expecting the patient.  Given lengthy delays in current transport service times and the fact that the patient is currently hemodynamically stable and in no acute  distress, he will be allowed to travel by personal vehicle to [facility] for further evaluation and treatment.  Plan of care and expected intervention was discussed with the patient who expresses understanding and is in agreement.  Patient is hemodynamically stable with no respiratory distress at time of departure from the ED.    CONSULTS:  GI at [facility]    PROCEDURES:  None    FINAL IMPRESSION      1. Food impaction of esophagus          DISPOSITION/PLAN   DISPOSITION Decision to Transfer  PATIENT REFERRED TO:  No follow-up provider specified.    DISCHARGE MEDICATIONS:  New Prescriptions    No medications on file       (Please note that portions of this note were completed with a voice recognition program.  Efforts were made to edit the dictations but occasionally words are mis-transcribed.)    Bobette Moracy D Danniel Tones, MD  Attending Emergency Physician        Bobette Moracy D Rolla Servidio, MD  02/25/13 [phone]

## 2013-02-25 ENCOUNTER — Inpatient Hospital Stay: Admit: 2013-02-25 | Discharge: 2013-02-25 | Attending: Emergency Medicine

## 2013-02-25 MED ORDER — MIDAZOLAM HCL 2 MG/2ML IJ SOLN
2 | Freq: Once | INTRAMUSCULAR | Status: AC | PRN
Start: 2013-02-25 — End: 2013-02-24
  Administered 2013-02-25: 04:00:00 2 via INTRAVENOUS

## 2013-02-25 MED ORDER — MIDAZOLAM HCL 2 MG/2ML IJ SOLN
2 | Freq: Once | INTRAMUSCULAR | Status: AC | PRN
Start: 2013-02-25 — End: 2013-02-24
  Administered 2013-02-25: 04:00:00 1 via INTRAVENOUS

## 2013-02-25 MED ORDER — FENTANYL CITRATE 0.05 MG/ML IJ SOLN
0.05 | Freq: Once | INTRAMUSCULAR | Status: AC | PRN
Start: 2013-02-25 — End: 2013-02-24
  Administered 2013-02-25: 04:00:00 50 via INTRAVENOUS

## 2013-02-25 MED ORDER — MIDAZOLAM HCL 2 MG/2ML IJ SOLN
2 | Freq: Once | INTRAMUSCULAR | Status: AC | PRN
Start: 2013-02-25 — End: 2013-02-24
  Administered 2013-02-25: 05:00:00 1 via INTRAVENOUS

## 2013-02-25 MED ORDER — OMEPRAZOLE 20 MG PO CPDR
20 MG | ORAL_CAPSULE | Freq: Every day | ORAL | Status: DC
Start: 2013-02-25 — End: 2013-04-09

## 2013-02-25 MED ADMIN — glucagon (rDNA) injection 1 mg: 1 mg | INTRAVENOUS | @ 02:00:00 | NDC 55390000401

## 2013-02-25 MED FILL — FENTANYL CITRATE 0.05 MG/ML IJ SOLN: 0.05 MG/ML | INTRAMUSCULAR | Qty: 2

## 2013-02-25 MED FILL — GLUCAGEN 1 MG IJ SOLR: 1 MG | INTRAMUSCULAR | Qty: 1

## 2013-02-25 MED FILL — MIDAZOLAM HCL 10 MG/10ML IJ SOLN: 10 MG/ML | INTRAMUSCULAR | Qty: 10

## 2013-02-25 MED FILL — SODIUM CHLORIDE 0.9 % IV SOLN: 0.9 % | INTRAVENOUS | Qty: 2000

## 2013-04-06 LAB — COMPREHENSIVE METABOLIC PANEL
ALT: 28 U/L (ref 10–40)
AST: 24 U/L (ref 15–37)
Albumin/Globulin Ratio: 2 (ref 1.1–2.2)
Albumin: 4.7 g/dL (ref 3.4–5.0)
Alkaline Phosphatase: 62 U/L (ref 40–129)
Anion Gap: 14 (ref 3–16)
BUN: 11 mg/dL (ref 7–20)
CO2: 26 mmol/L (ref 21–32)
Calcium: 9.4 mg/dL (ref 8.3–10.6)
Chloride: 102 mmol/L (ref 99–110)
Creatinine: 0.9 mg/dL (ref 0.9–1.3)
GFR African American: >60 (ref >60)
GFR Non-African American: >60 (ref >60)
Globulin: 2.3 g/dL
Glucose: 130 mg/dL — ABNORMAL HIGH (ref 70–99)
Potassium: 4.9 mmol/L (ref 3.5–5.1)
Sodium: 142 mmol/L (ref 136–145)
Total Bilirubin: 0.5 mg/dL (ref 0.0–1.0)
Total Protein: 7 g/dL (ref 6.4–8.2)

## 2013-04-06 LAB — LIPID PANEL
Cholesterol, Total: 158 mg/dL (ref 0–199)
HDL: 86 mg/dL — ABNORMAL HIGH (ref 40–60)
LDL Calculated: 65 mg/dL (ref <100)
Triglycerides: 36 mg/dL (ref 0–150)
VLDL Cholesterol Calculated: 7 mg/dL

## 2013-04-06 LAB — CBC
Hematocrit: 46.8 % (ref 40.5–52.5)
Hemoglobin: 16.2 g/dL (ref 13.5–17.5)
MCH: 34.2 pg — ABNORMAL HIGH (ref 26.0–34.0)
MCHC: 34.6 g/dL (ref 31.0–36.0)
MCV: 99 fL (ref 80.0–100.0)
MPV: 10.3 fL (ref 5.0–10.5)
Platelets: 194 10*3/uL (ref 135–450)
RBC: 4.73 M/uL (ref 4.20–5.90)
RDW: 12.5 % (ref 12.4–15.4)
WBC: 8.9 10*3/uL (ref 4.0–11.0)

## 2013-04-06 LAB — POCT URINALYSIS DIPSTICK
Bilirubin, UA: NEGATIVE
Blood, UA POC: NEGATIVE
Glucose, UA POC: NEGATIVE
Ketones, UA: NEGATIVE
Leukocytes, UA: NEGATIVE
Nitrite, UA: NEGATIVE
Protein, UA POC: NEGATIVE
Spec Grav, UA: 1.015
Urobilinogen, UA: 0.2
pH, UA: 6

## 2013-04-06 LAB — MICROALBUMIN / CREATININE URINE RATIO
Creatinine, Ur: 82.3 mg/dL (ref 39.0–259.0)
Microalbumin, Random Urine: <1.2 mg/dL (ref <2.0)

## 2013-04-06 LAB — TSH: TSH: 0.95 u[IU]/mL (ref 0.27–4.20)

## 2013-04-06 NOTE — Progress Notes (Signed)
Patient here for fasting blood work

## 2013-04-07 LAB — HEMOGLOBIN A1C
Hemoglobin A1C: 6.8 %
eAG: 148.5 mg/dL

## 2013-04-09 MED ORDER — INSULIN GLARGINE 100 UNIT/ML SC SOPN
100 UNIT/ML | PEN_INJECTOR | Freq: Every evening | SUBCUTANEOUS | Status: DC
Start: 2013-04-09 — End: 2014-04-30

## 2013-04-09 MED ORDER — INSULIN PEN NEEDLE 31G X 8 MM MISC
Status: DC
Start: 2013-04-09 — End: 2015-06-17

## 2013-04-09 MED ORDER — SITAGLIPTIN-METFORMIN HCL 50-500 MG PO TABS
50-500 MG | ORAL_TABLET | Freq: Two times a day (BID) | ORAL | Status: DC
Start: 2013-04-09 — End: 2014-11-02

## 2013-04-09 MED ORDER — BLOOD GLUCOSE TEST VI STRP
ORAL_STRIP | Status: DC
Start: 2013-04-09 — End: 2013-10-02

## 2013-04-09 MED ORDER — SIMVASTATIN 40 MG PO TABS
40 MG | ORAL_TABLET | Freq: Every day | ORAL | Status: DC
Start: 2013-04-09 — End: 2014-04-30

## 2013-04-09 MED ORDER — LISINOPRIL 10 MG PO TABS
10 MG | ORAL_TABLET | Freq: Every day | ORAL | Status: DC
Start: 2013-04-09 — End: 2014-04-30

## 2013-04-09 MED ORDER — LANCETS MISC
Status: DC
Start: 2013-04-09 — End: 2013-10-02

## 2013-04-09 NOTE — Progress Notes (Signed)
BP Readings from Last 2 Encounters:   04/09/13 118/60   02/25/13 142/67     A1c (no units)   Date Value   07/29/2009 8.4*        Hemoglobin A1C (%)   Date Value   04/06/2013 6.8         MICROALBUMIN, RANDOM URINE (mg/dL)   Date Value   1/61/09603/30/2015 <1.20         LDL Calculated (mg/dL)   Date Value   4/54/09813/30/2015 65               Tobacco use:  Patient  reports that he has never smoked. He quit smokeless tobacco use about 3 years ago.    If Smoker - Cessation materials given? NA    Is Daily aspirin on medication list?:  Yes    Diabetic retinal exam done? No   If yes, document in Health Maintenance.     Monofilament placed on counter? Yes    Shoes and socks removed? Yes    BP taken with correct size cuff? Yes   Repeated if > 140/90 No     Is patient taking any medications for diabetes    Yes   If yes, see medication list    Is the patient reporting any side effects of diabetic medications?   Yes    Microalbumin performed if applicable?  Yes      Is patient taking any over the counter medications    Yes   If yes, see medication list

## 2013-04-09 NOTE — Progress Notes (Signed)
Subjective:      Patient ID: Jeremiah Guzman is a 53 y.o. male.    HPI Comments: Patient presents with:  Diabetes: Patient here for a follow up on Diabetes and to discuss his Lab Results.         Diabetes  He presents for his follow-up diabetic visit. He has type 2 diabetes mellitus. Hypoglycemia symptoms include nervousness/anxiousness, sweats and tremors. Pertinent negatives for hypoglycemia include no confusion, dizziness, headaches, pallor or seizures. Pertinent negatives for diabetes include no fatigue and no polyphagia. There are no hypoglycemic complications. Symptoms are stable. There are no diabetic complications. Current diabetic treatment includes insulin injections and oral agent (dual therapy). He is compliant with treatment all of the time. His weight is decreasing steadily. He is following a generally healthy diet. He participates in exercise daily. His breakfast blood glucose range is generally 90-110 mg/dl. An ACE inhibitor/angiotensin II receptor blocker is being taken. Eye exam is not current.       Review of Systems   Constitutional: Negative.  Negative for fatigue.   HENT: Negative for ear pain, hearing loss and sinus pressure.    Eyes: Negative.    Respiratory: Negative for apnea, choking, shortness of breath and stridor.    Cardiovascular: Negative.    Gastrointestinal: Negative.    Endocrine: Negative for polyphagia.   Genitourinary: Negative.  Negative for hematuria, discharge, penile swelling and penile pain.   Musculoskeletal: Negative.  Negative for back pain.   Skin: Negative for color change and pallor.   Neurological: Positive for tremors. Negative for dizziness, seizures, syncope and headaches.   Hematological: Does not bruise/bleed easily.   Psychiatric/Behavioral: Negative for confusion, dysphoric mood and decreased concentration. The patient is nervous/anxious.        Objective:   Physical Exam   Constitutional: He appears well-developed and well-nourished.   HENT:   Mouth/Throat: No  oropharyngeal exudate.   Eyes: Conjunctivae and EOM are normal. Pupils are equal, round, and reactive to light. No scleral icterus.   Neck: Normal range of motion. No thyromegaly present.   Cardiovascular: Normal rate, regular rhythm and intact distal pulses.    Pulmonary/Chest: Effort normal. He has no wheezes. He exhibits no tenderness.   Abdominal: Soft. He exhibits no mass. There is no tenderness.   Musculoskeletal: Normal range of motion. He exhibits no edema.   Lymphadenopathy:     He has no cervical adenopathy.   Neurological: He is alert. No cranial nerve deficit.   Skin: Skin is warm.   Psychiatric: His behavior is normal.       Assessment:      Encounter Diagnoses   Name Primary?   ??? DM (diabetes mellitus) (HCC) Yes   ??? HBP (high blood pressure)    ??? Hyperlipidemia             Plan:      Jeremiah Guzman was seen today for diabetes.    Diagnoses and associated orders for this visit:    DM (diabetes mellitus) (HCC)  - insulin glargine (LANTUS SOLOSTAR) 100 UNIT/ML injection pen; Inject 50 Units into the skin nightly.  - Glucose Blood (BLOOD GLUCOSE TEST STRIPS) STRP; Test tid with glucometer of one touch Verio IQ.  - Lancets MISC; Test tid  - Insulin Pen Needle (B-D ULTRAFINE III SHORT PEN) 31G X 8 MM MISC; Patient test BID  Dx 250.00    HBP (high blood pressure)    Hyperlipidemia    Other Orders  - lisinopril (PRINIVIL;ZESTRIL) 10  MG tablet; Take 1 tablet by mouth daily.  - simvastatin (ZOCOR) 40 MG tablet; Take 1 tablet by mouth daily.  - sitaGLIPtan-metformin (JANUMET) 50-500 MG per tablet; Take 1 tablet by mouth 2 times daily (with meals) for 360 days.

## 2013-04-09 NOTE — Patient Instructions (Signed)
Please call your pharmacy if you need any refills of your medication(s).    Please call our office at [phone] if you don't hear from us about your test results.                                                                 Bring an accurate list of your medications with you at every appointment to ensure that we have the correct information.    Our office hours are: Monday - Friday 8:30 am- 5 pm    Phone lines turn on at 8:30 am    Patient Self-Management Goal for Chronic Condition  Goal: I will exercise for 30 minutes, 3-5 days per week.  Barriers to success: none  Plan for overcoming my barriers: N/A     Confidence: 10/10  Date goal set: 04/09/2013  Date goal attained:

## 2013-10-02 ENCOUNTER — Telehealth

## 2013-10-02 MED ORDER — ACCU-CHEK SOFTCLIX LANCETS MISC
Status: DC
Start: 2013-10-02 — End: 2017-05-02

## 2013-10-02 MED ORDER — GLUCOSE BLOOD VI STRP
Status: DC
Start: 2013-10-02 — End: 2017-05-02

## 2013-10-02 MED ORDER — ACCU-CHEK AVIVA PLUS W/DEVICE KIT
PACK | Status: DC
Start: 2013-10-02 — End: 2017-05-02

## 2013-10-02 NOTE — Telephone Encounter (Signed)
rx's sent

## 2013-10-02 NOTE — Telephone Encounter (Signed)
Pt is requesting an Rx for this and this is what his pharmacy carries.    [facility]  [phone]  Phone: [phone]    Accu check plus meter  Aviva test strips  Aviva clix lantus

## 2013-10-09 ENCOUNTER — Encounter: Attending: Internal Medicine

## 2013-11-18 ENCOUNTER — Encounter

## 2013-11-18 ENCOUNTER — Telehealth

## 2013-11-18 LAB — COMPREHENSIVE METABOLIC PANEL
ALT: 26 U/L (ref 10–40)
AST: 19 U/L (ref 15–37)
Albumin/Globulin Ratio: 2 (ref 1.1–2.2)
Albumin: 4.5 g/dL (ref 3.4–5.0)
Alkaline Phosphatase: 54 U/L (ref 40–129)
Anion Gap: 17 — ABNORMAL HIGH (ref 3–16)
BUN: 13 mg/dL (ref 7–20)
CO2: 23 mmol/L (ref 21–32)
Calcium: 9.7 mg/dL (ref 8.3–10.6)
Chloride: 102 mmol/L (ref 99–110)
Creatinine: 1 mg/dL (ref 0.9–1.3)
GFR African American: >60 (ref >60)
GFR Non-African American: >60 (ref >60)
Globulin: 2.3 g/dL
Glucose: 220 mg/dL — ABNORMAL HIGH (ref 70–99)
Potassium: 5.3 mmol/L — ABNORMAL HIGH (ref 3.5–5.1)
Sodium: 142 mmol/L (ref 136–145)
Total Bilirubin: 0.5 mg/dL (ref 0.0–1.0)
Total Protein: 6.8 g/dL (ref 6.4–8.2)

## 2013-11-18 LAB — CBC
Hematocrit: 47.6 % (ref 40.5–52.5)
Hemoglobin: 16.2 g/dL (ref 13.5–17.5)
MCH: 33.9 pg (ref 26.0–34.0)
MCHC: 34.1 g/dL (ref 31.0–36.0)
MCV: 99.2 fL (ref 80.0–100.0)
MPV: 9.7 fL (ref 5.0–10.5)
Platelets: 207 10*3/uL (ref 135–450)
RBC: 4.8 M/uL (ref 4.20–5.90)
RDW: 12.4 % (ref 12.4–15.4)
WBC: 7.9 10*3/uL (ref 4.0–11.0)

## 2013-11-18 LAB — TSH: TSH: 0.97 u[IU]/mL (ref 0.27–4.20)

## 2013-11-18 LAB — LIPID PANEL
Cholesterol, Total: 168 mg/dL (ref 0–199)
HDL: 64 mg/dL — ABNORMAL HIGH (ref 40–60)
LDL Calculated: 91 mg/dL (ref <100)
Triglycerides: 66 mg/dL (ref 0–150)
VLDL Cholesterol Calculated: 13 mg/dL

## 2013-11-18 LAB — PSA SCREENING: PSA: 1.73 ng/mL (ref 0.00–4.00)

## 2013-11-18 NOTE — Telephone Encounter (Signed)
Needs orders to have blood work done at the [facility]

## 2013-11-19 LAB — HEMOGLOBIN A1C
Hemoglobin A1C: 7.7 %
eAG: 174.3 mg/dL

## 2013-12-10 ENCOUNTER — Ambulatory Visit: Admit: 2013-12-10 | Discharge: 2013-12-10 | Payer: PRIVATE HEALTH INSURANCE | Attending: Internal Medicine

## 2013-12-10 DIAGNOSIS — Z0000 Encounter for general adult medical examination without abnormal findings: Secondary | ICD-10-CM

## 2013-12-10 MED ORDER — SILDENAFIL CITRATE 100 MG PO TABS
100 | ORAL_TABLET | ORAL | Status: DC | PRN
Start: 2013-12-10 — End: 2014-10-15

## 2013-12-10 NOTE — Progress Notes (Signed)
Flu  Vaccine given per order. Consent form signed. See immunization record for documentation.

## 2013-12-10 NOTE — Progress Notes (Signed)
Subjective:      Patient ID: Jeremiah Guzman is a 53 y.o. male.    HPI Comments: Patient presents with:  Annual Exam: Patient here for an Annual Exam and to discuss his Lab Results.           Review of Systems   Constitutional: Negative.  Negative for fatigue.   HENT: Negative for ear pain, hearing loss and sinus pressure.    Eyes: Negative.    Respiratory: Negative for apnea, choking and stridor.    Cardiovascular: Negative.    Gastrointestinal:        Difficulty swallowing. Episode of food impaction.   Genitourinary: Negative for hematuria, discharge and penile swelling.   Musculoskeletal: Negative.  Negative for back pain.   Skin: Negative for color change and pallor.   Neurological: Negative for dizziness, tremors, seizures and syncope.   Hematological: Does not bruise/bleed easily.   Psychiatric/Behavioral: Negative for confusion, dysphoric mood and decreased concentration.       Objective:   Physical Exam   Constitutional: He appears well-developed and well-nourished.   HENT:   Mouth/Throat: No oropharyngeal exudate.   Eyes: Conjunctivae and EOM are normal. Pupils are equal, round, and reactive to light. No scleral icterus.   Neck: Normal range of motion. No thyromegaly present.   Cardiovascular: Normal rate, regular rhythm and intact distal pulses.    Pulmonary/Chest: Effort normal. He has no wheezes. He exhibits no tenderness.   Abdominal: Soft. He exhibits no mass. There is no tenderness.   Musculoskeletal: Normal range of motion. He exhibits no edema.   Lymphadenopathy:     He has no cervical adenopathy.   Neurological: He is alert. No cranial nerve deficit.   Skin: Skin is warm.   Psychiatric: His behavior is normal.       Assessment:      Encounter Diagnoses   Name Primary?   ??? Routine general medical examination at a health care facility Yes   ??? Need for prophylactic vaccination and inoculation against influenza    ??? Diabetes mellitus type 1, uncontrolled (HCC)    ??? Other male erectile dysfunction    ???  Dysphagia, unspecified dysphagia             Plan:      Jeremiah Guzman was seen today for annual exam.    Diagnoses and associated orders for this visit:    Routine general medical examination at a health care facility    Need for prophylactic vaccination and inoculation against influenza  - FLU VACCINE =>3YO IM QUADRIVALENT PRESERVATIVE FREE    Diabetes mellitus type 1, uncontrolled (HCC)    Other male erectile dysfunction  - sildenafil (VIAGRA) 100 MG tablet; Take 1 tablet by mouth as needed for Erectile Dysfunction    Dysphagia, unspecified dysphagia  - Ambulatory referral to Gastroenterology                 Jeremiah Guzman received counseling on the following healthy behaviors: nutrition, exercise and medication adherence    Patient given educational materials on Diabetes, Hyperlipidemia, Nutrition and Hypertension    I have instructed Jeremiah Guzman to complete a self tracking handout on Blood Sugars , Blood Pressures  and Weights and instructed them to bring it with them to his next appointment.     Discussed use, benefit, and side effects of prescribed medications.  Barriers to medication compliance addressed.  All patient questions answered.  Pt voiced understanding.

## 2013-12-10 NOTE — Patient Instructions (Signed)
Please call your pharmacy if you need any refills of your medication(s).    Please call our office at [phone] if you don't hear from us about your test results.                                                                 Bring an accurate list of your medications with you at every appointment to ensure that we have the correct information.    Our office hours are: Monday - Friday 8:30 am- 5 pm    Phone lines turn on at 8:30 am

## 2014-04-30 ENCOUNTER — Encounter

## 2014-04-30 MED ORDER — LISINOPRIL 10 MG PO TABS
10 MG | ORAL_TABLET | Freq: Every day | ORAL | Status: DC
Start: 2014-04-30 — End: 2015-04-04

## 2014-04-30 MED ORDER — INSULIN GLARGINE 100 UNIT/ML SC SOPN
100 UNIT/ML | PEN_INJECTOR | Freq: Every evening | SUBCUTANEOUS | Status: DC
Start: 2014-04-30 — End: 2014-12-13

## 2014-04-30 MED ORDER — SIMVASTATIN 40 MG PO TABS
40 MG | ORAL_TABLET | Freq: Every day | ORAL | Status: DC
Start: 2014-04-30 — End: 2015-04-04

## 2014-04-30 NOTE — Telephone Encounter (Signed)
Jeremiah Guzman called he could not get [employer] to request refills for Lisinopril 10 mg, Simvastatin 40 mg and Lantus. Please call if he needs an appointment, I told him I think he does, but he wanted to talk to you first. He also wants to know if he can use something cheaper than Janumet. Please call to [facility].

## 2014-10-15 ENCOUNTER — Encounter

## 2014-10-15 MED ORDER — SILDENAFIL CITRATE 100 MG PO TABS
100 | ORAL_TABLET | ORAL | 5 refills | Status: DC | PRN
Start: 2014-10-15 — End: 2015-11-22

## 2014-10-15 NOTE — Telephone Encounter (Signed)
Pt going out of town this evening and would like RX sent to Pharmacy so he can pick up before he leaves. Would Like a call to inform if if it can be filled or not.

## 2014-11-02 ENCOUNTER — Encounter

## 2014-11-02 MED ORDER — SITAGLIPTIN-METFORMIN HCL 50-500 MG PO TABS
50-500 MG | ORAL_TABLET | Freq: Two times a day (BID) | ORAL | 3 refills | Status: DC
Start: 2014-11-02 — End: 2015-08-12

## 2014-11-02 MED ORDER — INSULIN LISPRO 100 UNIT/ML SC SOLN
100 UNIT/ML | Freq: Three times a day (TID) | SUBCUTANEOUS | 1 refills | Status: DC
Start: 2014-11-02 — End: 2016-02-16

## 2014-11-30 ENCOUNTER — Telehealth

## 2014-11-30 NOTE — Telephone Encounter (Signed)
Added cbc, labs.

## 2014-11-30 NOTE — Telephone Encounter (Signed)
Where is he having them? Called pt.  Going to [facility]. Orders in.

## 2014-11-30 NOTE — Telephone Encounter (Signed)
Would like an order for blood work for his physical scheduled 12/05

## 2014-12-01 ENCOUNTER — Encounter

## 2014-12-01 ENCOUNTER — Telehealth

## 2014-12-01 NOTE — Telephone Encounter (Signed)
Please change labs to lab collect as soon as possible.

## 2014-12-01 NOTE — Telephone Encounter (Signed)
Can you do these lori they called back.

## 2014-12-02 LAB — COMPREHENSIVE METABOLIC PANEL
ALT: 23 U/L (ref 10–40)
AST: 17 U/L (ref 15–37)
Albumin/Globulin Ratio: 2 (ref 1.1–2.2)
Albumin: 4.4 g/dL (ref 3.4–5.0)
Alkaline Phosphatase: 67 U/L (ref 40–129)
Anion Gap: 10 (ref 3–16)
BUN: 13 mg/dL (ref 7–20)
CO2: 28 mmol/L (ref 21–32)
Calcium: 9.2 mg/dL (ref 8.3–10.6)
Chloride: 102 mmol/L (ref 99–110)
Creatinine: 0.9 mg/dL (ref 0.9–1.3)
GFR African American: >60 (ref >60)
GFR Non-African American: >60 (ref >60)
Globulin: 2.2 g/dL
Glucose: 212 mg/dL — ABNORMAL HIGH (ref 70–99)
Potassium: 5 mmol/L (ref 3.5–5.1)
Sodium: 140 mmol/L (ref 136–145)
Total Bilirubin: 0.4 mg/dL (ref 0.0–1.0)
Total Protein: 6.6 g/dL (ref 6.4–8.2)

## 2014-12-02 LAB — CBC
Hematocrit: 47.4 % (ref 40.5–52.5)
Hemoglobin: 16 g/dL (ref 13.5–17.5)
MCH: 33 pg (ref 26.0–34.0)
MCHC: 33.8 g/dL (ref 31.0–36.0)
MCV: 97.5 fL (ref 80.0–100.0)
MPV: 9.7 fL (ref 5.0–10.5)
Platelets: 179 10*3/uL (ref 135–450)
RBC: 4.87 M/uL (ref 4.20–5.90)
RDW: 12.3 % — ABNORMAL LOW (ref 12.4–15.4)
WBC: 7.9 10*3/uL (ref 4.0–11.0)

## 2014-12-02 LAB — LIPID PANEL
Cholesterol, Total: 161 mg/dL (ref 0–199)
HDL: 60 mg/dL (ref 40–60)
LDL Calculated: 86 mg/dL (ref <100)
Triglycerides: 77 mg/dL (ref 0–150)
VLDL Cholesterol Calculated: 15 mg/dL

## 2014-12-02 LAB — TSH: TSH: 1.33 u[IU]/mL (ref 0.27–4.20)

## 2014-12-02 LAB — HEMOGLOBIN A1C
Hemoglobin A1C: 8.5 %
eAG: 197.3 mg/dL

## 2014-12-02 LAB — PSA SCREENING: PSA: 1.37 ng/mL (ref 0.00–4.00)

## 2014-12-13 ENCOUNTER — Ambulatory Visit: Admit: 2014-12-13 | Discharge: 2014-12-13 | Payer: PRIVATE HEALTH INSURANCE | Attending: Internal Medicine

## 2014-12-13 DIAGNOSIS — Z0000 Encounter for general adult medical examination without abnormal findings: Secondary | ICD-10-CM

## 2014-12-13 LAB — POCT URINALYSIS DIPSTICK
Bilirubin, UA: NEGATIVE
Blood, UA POC: NEGATIVE
Ketones, UA: NEGATIVE
Leukocytes, UA: NEGATIVE
Nitrite, UA: NEGATIVE
Protein, UA POC: NEGATIVE
Spec Grav, UA: 1.02
Urobilinogen, UA: 0.2
pH, UA: 5

## 2014-12-13 MED ORDER — INSULIN GLARGINE 300 UNIT/ML SC SOPN
300 UNIT/ML | PEN_INJECTOR | Freq: Every evening | SUBCUTANEOUS | 3 refills | Status: DC
Start: 2014-12-13 — End: 2015-08-15

## 2014-12-13 MED ORDER — INSULIN SYRINGE-NEEDLE U-100 31G X 5/16" 1 ML MISC
Freq: Every day | 5 refills | Status: AC
Start: 2014-12-13 — End: ?

## 2014-12-13 NOTE — Patient Instructions (Addendum)
Please call your pharmacy if you need any refills of your medication(s).    Please call our office at [phone] if you don't hear from us about your test results.                                                                 Bring an accurate list of your medications with you at every appointment to ensure that we have the correct information.    Our office hours are: Monday - Friday 8:30 am- 5 pm    Phone lines turn on at 8:30 am    Patient Self-Management Goal for Chronic Condition  Goal: I will take all medications as prescribed by my doctor, and I will call the office if I am having any medication problems.  Barriers to success: none  Plan for overcoming my barriers: N/A     Confidence: 10/10  Date goal set: 12/13/14  Date goal attained:

## 2014-12-13 NOTE — Progress Notes (Signed)
Subjective:      Patient ID: Jeremiah Guzman is a 54 y.o. male.    HPI Comments: Patient presents with:  Annual Exam: annual check, discuss labs.  Medication Request: needs syringes and needles for his humalog  Lymphadenopathy: sore gland on left side of neck.   Flu Vaccine: flu inj          Review of Systems   Constitutional: Negative.  Negative for appetite change.   HENT: Negative.  Negative for ear pain, hearing loss and sinus pressure.    Eyes: Negative.    Respiratory: Negative.  Negative for apnea, choking and stridor.    Cardiovascular: Negative.    Gastrointestinal: Negative.    Endocrine: Negative.  Negative for polydipsia.   Genitourinary: Negative.  Negative for discharge, hematuria, penile pain and penile swelling.   Musculoskeletal: Positive for neck pain (lt side mid neck sore but no enlarged LN.). Negative for back pain.   Skin: Negative.  Negative for color change and pallor.   Neurological: Negative.  Negative for dizziness, tremors, seizures and syncope.   Hematological: Does not bruise/bleed easily.   Psychiatric/Behavioral: Negative.  Negative for confusion, decreased concentration and dysphoric mood.       Objective:   Physical Exam   Constitutional: He appears well-developed and well-nourished.   HENT:   Mouth/Throat: No oropharyngeal exudate.   Eyes: Conjunctivae and EOM are normal. Pupils are equal, round, and reactive to light. No scleral icterus.   Neck: Normal range of motion. No thyromegaly present.   Cardiovascular: Normal rate, regular rhythm and intact distal pulses.    Pulmonary/Chest: Effort normal. He has no wheezes. He exhibits no tenderness.   Abdominal: Soft. He exhibits no mass. There is no tenderness.   Musculoskeletal: Normal range of motion. He exhibits no edema.   Lymphadenopathy:     He has no cervical adenopathy.   Neurological: He is alert. No cranial nerve deficit.   Skin: Skin is warm.   Psychiatric: His behavior is normal.       Assessment:      Encounter Diagnoses    Name Primary?   ??? Routine general medical examination at a health care facility Yes   ??? Type 2 diabetes mellitus without complication, with long-term current use of insulin (HCC)    ??? Pure hypercholesterolemia            Plan:      [name] was seen today for annual exam, medication request, lymphadenopathy and flu vaccine.    Diagnoses and all orders for this visit:    Routine general medical examination at a health care facility  -     POCT Urinalysis no Micro    Type 2 diabetes mellitus without complication, with long-term current use of insulin (HCC)  -     insulin glargine (TOUJEO SOLOSTAR) 300 UNIT/ML injection pen; Inject 60 Units into the skin nightly  -     Insulin Syringe-Needle U-100 31G X 5/16" 1 ML MISC; 1 each by Does not apply route daily    Pure hypercholesterolemia

## 2014-12-13 NOTE — Addendum Note (Signed)
Addended by: Clide DalesBECK, Torra Pala on: 12/13/2014 10:09 AM     Modules accepted: Orders

## 2015-03-01 ENCOUNTER — Ambulatory Visit: Admit: 2015-03-01 | Discharge: 2015-03-01 | Payer: PRIVATE HEALTH INSURANCE | Attending: Internal Medicine

## 2015-03-01 DIAGNOSIS — J069 Acute upper respiratory infection, unspecified: Secondary | ICD-10-CM

## 2015-03-01 MED ORDER — AZITHROMYCIN 250 MG PO TABS
250 MG | ORAL_TABLET | ORAL | 0 refills | Status: AC
Start: 2015-03-01 — End: 2015-03-11

## 2015-03-01 MED ORDER — GUAIFENESIN-CODEINE 100-10 MG/5ML PO SYRP
100-10 MG/5ML | Freq: Three times a day (TID) | ORAL | 0 refills | Status: AC | PRN
Start: 2015-03-01 — End: 2015-03-08

## 2015-03-01 NOTE — Patient Instructions (Signed)
Please call your pharmacy if you need any refills of your medication(s).    Please call our office at [phone] if you don't hear from us about your test results.                                                                 Bring an accurate list of your medications with you at every appointment to ensure that we have the correct information.    Our office hours are: Monday - Friday 8:30 am- 5 pm    Phone lines turn on at 8:30 am

## 2015-03-01 NOTE — Progress Notes (Signed)
Subjective:      Patient ID: Jeremiah Guzman is a 55 y.o. male.    HPI Comments: Patient presents with:  Pharyngitis: 4 days, also cough, fatigue.        Pharyngitis   This is a new problem. The current episode started in the past 7 days. The problem has been unchanged. Associated symptoms include coughing, fatigue and a sore throat. Pertinent negatives include no chills, diaphoresis or numbness. He has tried nothing for the symptoms. The treatment provided no relief.       Review of Systems   Constitutional: Positive for fatigue. Negative for chills and diaphoresis.   HENT: Positive for sore throat. Negative for ear pain, hearing loss, rhinorrhea and sinus pressure.    Eyes: Negative.    Respiratory: Positive for cough. Negative for apnea, choking and stridor.         More dry cough.   Cardiovascular: Negative.    Gastrointestinal: Negative.    Genitourinary: Negative.  Negative for discharge, hematuria, penile pain and penile swelling.   Musculoskeletal: Negative.  Negative for back pain.   Skin: Negative for color change and pallor.   Neurological: Negative for dizziness, tremors, seizures, syncope and numbness.   Hematological: Does not bruise/bleed easily.   Psychiatric/Behavioral: Negative for confusion, decreased concentration and dysphoric mood.       Objective:   Physical Exam   Constitutional: He appears well-developed and well-nourished.   HENT:   Mouth/Throat: No oropharyngeal exudate.   Eyes: Conjunctivae and EOM are normal. Pupils are equal, round, and reactive to light. No scleral icterus.   Neck: Normal range of motion. No thyromegaly present.   Cardiovascular: Normal rate, regular rhythm and intact distal pulses.    Pulmonary/Chest: Effort normal. He has no wheezes. He exhibits no tenderness.   Abdominal: Soft. He exhibits no mass. There is no tenderness.   Musculoskeletal: Normal range of motion. He exhibits no edema.   Lymphadenopathy:     He has no cervical adenopathy.   Neurological: He is alert.  No cranial nerve deficit.   Skin: Skin is warm.   Psychiatric: His behavior is normal.       Assessment:      Encounter Diagnosis   Name Primary?   ??? Acute URI Yes           Plan:      Hikaru was seen today for pharyngitis.    Diagnoses and all orders for this visit:    Acute URI  -     azithromycin (ZITHROMAX Z-PAK) 250 MG tablet; As directed.  -     guaiFENesin-codeine (TUSSI-ORGANIDIN NR) 100-10 MG/5ML syrup; Take 10 mLs by mouth 3 times daily as needed for Cough

## 2015-03-17 ENCOUNTER — Encounter: Attending: Internal Medicine

## 2015-04-04 MED ORDER — LISINOPRIL 10 MG PO TABS
10 MG | ORAL_TABLET | ORAL | 3 refills | Status: DC
Start: 2015-04-04 — End: 2015-08-12

## 2015-04-04 MED ORDER — LANTUS SOLOSTAR 100 UNIT/ML SC SOPN
100 UNIT/ML | SUBCUTANEOUS | 3 refills | Status: DC
Start: 2015-04-04 — End: 2015-11-22

## 2015-04-04 MED ORDER — SIMVASTATIN 40 MG PO TABS
40 MG | ORAL_TABLET | ORAL | 3 refills | Status: DC
Start: 2015-04-04 — End: 2015-08-12

## 2015-05-12 ENCOUNTER — Telehealth

## 2015-05-12 NOTE — Telephone Encounter (Signed)
Patient called and asked that his routine blood work be ordered so he can complete the blood draw by his next appointment on 06/03/15. Please call patient back at [phone]

## 2015-05-12 NOTE — Telephone Encounter (Signed)
Lab orders in [facility]. Pt informed

## 2015-06-01 ENCOUNTER — Encounter

## 2015-06-01 LAB — COMPREHENSIVE METABOLIC PANEL
ALT: 25 U/L (ref 10–40)
AST: 22 U/L (ref 15–37)
Albumin/Globulin Ratio: 1.9 (ref 1.1–2.2)
Albumin: 4.4 g/dL (ref 3.4–5.0)
Alkaline Phosphatase: 54 U/L (ref 40–129)
Anion Gap: 17 — ABNORMAL HIGH (ref 3–16)
BUN: 12 mg/dL (ref 7–20)
CO2: 23 mmol/L (ref 21–32)
Calcium: 8.9 mg/dL (ref 8.3–10.6)
Chloride: 99 mmol/L (ref 99–110)
Creatinine: 0.8 mg/dL — ABNORMAL LOW (ref 0.9–1.3)
GFR African American: >60 (ref >60)
GFR Non-African American: >60 (ref >60)
Globulin: 2.3 g/dL
Glucose: 83 mg/dL (ref 70–99)
Potassium: 4 mmol/L (ref 3.5–5.1)
Sodium: 139 mmol/L (ref 136–145)
Total Bilirubin: 0.4 mg/dL (ref 0.0–1.0)
Total Protein: 6.7 g/dL (ref 6.4–8.2)

## 2015-06-01 LAB — MICROALBUMIN / CREATININE URINE RATIO
Creatinine, Ur: 69.6 mg/dL (ref 39.0–259.0)
Microalbumin, Random Urine: <1.2 mg/dL (ref <2.0)

## 2015-06-01 LAB — LIPID PANEL
Cholesterol, Total: 155 mg/dL (ref 0–199)
HDL: 56 mg/dL (ref 40–60)
LDL Calculated: 79 mg/dL (ref <100)
Triglycerides: 101 mg/dL (ref 0–150)
VLDL Cholesterol Calculated: 20 mg/dL

## 2015-06-01 LAB — HEMOGLOBIN A1C
Hemoglobin A1C: 8 %
eAG: 182.9 mg/dL

## 2015-06-03 ENCOUNTER — Ambulatory Visit: Admit: 2015-06-03 | Discharge: 2015-06-03 | Payer: PRIVATE HEALTH INSURANCE | Attending: Internal Medicine

## 2015-06-03 DIAGNOSIS — IMO0002 Reserved for concepts with insufficient information to code with codable children: Secondary | ICD-10-CM

## 2015-06-03 MED ORDER — SILDENAFIL CITRATE 100 MG PO TABS
100 | ORAL_TABLET | ORAL | 3 refills | Status: DC | PRN
Start: 2015-06-03 — End: 2016-05-07

## 2015-06-03 MED ORDER — SILDENAFIL CITRATE 100 MG PO TABS
100 | ORAL_TABLET | ORAL | 5 refills | Status: DC | PRN
Start: 2015-06-03 — End: 2015-11-22

## 2015-06-03 NOTE — Progress Notes (Signed)
Subjective:      Patient ID: Jeremiah Guzman is a 55 y.o. male.    HPI Comments: Patient presents with:  Follow-up: discuss labs.         Diabetes   He presents for his follow-up diabetic visit. He has type 2 diabetes mellitus. The initial diagnosis of diabetes was made 10 years ago. His disease course has been stable. Pertinent negatives for hypoglycemia include no confusion, dizziness, pallor, seizures or tremors. There are no diabetic associated symptoms. Pertinent negatives for diabetes include no fatigue, no polyuria and no weight loss. There are no hypoglycemic complications. Symptoms are stable. Pertinent negatives for diabetic complications include no CVA. Current diabetic treatment includes insulin injections. He is compliant with treatment all of the time. He is following a generally healthy diet. He participates in exercise daily. His breakfast blood glucose range is generally 110-130 mg/dl. Eye exam is current.       Review of Systems   Constitutional: Negative.  Negative for fatigue, unexpected weight change and weight loss.   HENT: Negative.  Negative for ear pain, hearing loss and sinus pressure.    Eyes: Negative.  Negative for visual disturbance.   Respiratory: Negative.  Negative for apnea, choking and stridor.    Cardiovascular: Negative.    Gastrointestinal: Negative.    Endocrine: Negative for polyuria.   Genitourinary: Negative.  Negative for discharge, hematuria, penile pain and penile swelling.   Musculoskeletal: Positive for back pain.   Skin: Negative.  Negative for color change and pallor.   Neurological: Negative.  Negative for dizziness, tremors, seizures and syncope.   Hematological: Does not bruise/bleed easily.   Psychiatric/Behavioral: Negative for confusion, decreased concentration and dysphoric mood.       Objective:   Physical Exam   Constitutional: He appears well-developed and well-nourished.   HENT:   Mouth/Throat: No oropharyngeal exudate.   Eyes: Conjunctivae and EOM are  normal. Pupils are equal, round, and reactive to light. No scleral icterus.   Neck: Normal range of motion. No thyromegaly present.   Cardiovascular: Normal rate, regular rhythm and intact distal pulses.    Pulmonary/Chest: Effort normal. He has no wheezes. He exhibits no tenderness.   Abdominal: Soft. He exhibits no mass. There is no tenderness.   Musculoskeletal: Normal range of motion. He exhibits no edema.   Lymphadenopathy:     He has no cervical adenopathy.   Neurological: He is alert. No cranial nerve deficit.   Skin: Skin is warm.   Psychiatric: His behavior is normal.       Assessment:      Encounter Diagnoses   Name Primary?   ??? 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 Pure hypercholesterolemia    ??? Other male erectile dysfunction            Plan:      Jeremiah Guzman was seen today for follow-up.    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

## 2015-06-03 NOTE — Patient Instructions (Signed)
Please call your pharmacy if you need any refills of your medication(s).    Please call our office at [phone] if you don't hear from us about your test results.                                                                 Bring an accurate list of your medications with you at every appointment to ensure that we have the correct information.    Our office hours are: Monday - Friday 8:30 am- 5 pm    Phone lines turn on at 8:30 am    Patient Self-Management Goal for Chronic Condition  Goal: I will take all medications as prescribed by my doctor, and I will call the office if I am having any medication problems.  Barriers to success: none  Plan for overcoming my barriers: N/A     Confidence: 10/10  Date goal set: 06/03/15  Date goal attained:

## 2015-06-17 MED ORDER — INSULIN PEN NEEDLE 31G X 8 MM MISC
3 refills | 66.00000 days | Status: AC
Start: 2015-06-17 — End: ?

## 2015-06-17 NOTE — Telephone Encounter (Signed)
[place] and he just realized that the doesn't have the needles for humalog and lantus. Pt will be in [place] in a 1[place]5 months and pt wants to know if a rx [facility] [place] and then he'll have the rx transferred to a [facility] at the town where he's at. Pl advise pt when this has been done.

## 2015-06-17 NOTE — Telephone Encounter (Signed)
Pt calling again to see if the needles can be called in as soon as possible b/c he didn't get a shot last night and his blood sugar was over 200. Pl advise pt when rx has been sent in.

## 2015-06-17 NOTE — Telephone Encounter (Signed)
[facility] to [place] [facility], pt informed.

## 2015-08-12 MED ORDER — LISINOPRIL 10 MG PO TABS
10 | ORAL_TABLET | ORAL | 3 refills | Status: DC
Start: 2015-08-12 — End: 2016-07-08

## 2015-08-12 MED ORDER — SIMVASTATIN 40 MG PO TABS
40 | ORAL_TABLET | ORAL | 3 refills | Status: DC
Start: 2015-08-12 — End: 2016-07-08

## 2015-08-12 MED ORDER — SITAGLIPTIN PHOS-METFORMIN HCL 50-500 MG PO TABS
50-500 | ORAL_TABLET | Freq: Two times a day (BID) | ORAL | 3 refills | Status: DC
Start: 2015-08-12 — End: 2016-07-08

## 2015-08-12 NOTE — Telephone Encounter (Signed)
[facility] sitaGLIPtan-metformin (JANUMET) 50-500 MG per tablet, lisinopril (PRINIVIL;ZESTRIL) 10 MG tablet,simvastatin (ZOCOR) 40 MG tablet

## 2015-08-15 ENCOUNTER — Telehealth

## 2015-08-15 MED ORDER — BLOOD GLUCOSE TEST VI STRP
ORAL_STRIP | 3 refills | Status: DC
Start: 2015-08-15 — End: 2017-05-02

## 2015-08-15 MED ORDER — INSULIN DETEMIR 100 UNIT/ML SC SOPN
100 UNIT/ML | PEN_INJECTOR | Freq: Every evening | SUBCUTANEOUS | 3 refills | Status: DC
Start: 2015-08-15 — End: 2016-02-16

## 2015-08-15 MED ORDER — LANCETS MISC
3 refills | Status: DC
Start: 2015-08-15 — End: 2017-05-02

## 2015-08-15 MED ORDER — FREESTYLE SYSTEM KIT
PACK | Freq: Every day | 0 refills | Status: DC | PRN
Start: 2015-08-15 — End: 2017-05-02

## 2015-08-15 NOTE — Telephone Encounter (Signed)
toujeo is not on preferred list, per insurance.  Do you want to try a prior auth or change to basaglar kwikpen 100 u/mg or levemir flextouch?   accu check aviva also not on preferred list. They suggest freestyle freedom meter and strips.  Or we can try a prior auth?? [facility]

## 2015-08-15 NOTE — Telephone Encounter (Signed)
He can have Levemir instead of Toujeo if his insurance does not pay. It's about same insuline but more concentrated. He wil have new Accu-check glucometer.

## 2015-08-15 NOTE — Telephone Encounter (Signed)
Pt needs refill on his insulin glargine (TOUJEO SOLOSTAR) 300 UNIT/ML injection pen, new Accu-check aviva meter and test strips sent to [facility] [place] - [address] - [phone]

## 2015-11-17 ENCOUNTER — Encounter: Admit: 2015-11-17 | Discharge: 2015-11-17 | Payer: PRIVATE HEALTH INSURANCE

## 2015-11-17 ENCOUNTER — Telehealth

## 2015-11-17 DIAGNOSIS — E7800 Pure hypercholesterolemia, unspecified: Secondary | ICD-10-CM

## 2015-11-17 LAB — COMPREHENSIVE METABOLIC PANEL
ALT: 31 U/L (ref 10–40)
AST: 25 U/L (ref 15–37)
Albumin/Globulin Ratio: 1.8 (ref 1.1–2.2)
Albumin: 4.2 g/dL (ref 3.4–5.0)
Alkaline Phosphatase: 57 U/L (ref 40–129)
Anion Gap: 16 (ref 3–16)
BUN: 13 mg/dL (ref 7–20)
CO2: 26 mmol/L (ref 21–32)
Calcium: 9.3 mg/dL (ref 8.3–10.6)
Chloride: 100 mmol/L (ref 99–110)
Creatinine: 0.8 mg/dL — ABNORMAL LOW (ref 0.9–1.3)
GFR African American: >60 (ref >60)
GFR Non-African American: >60 (ref >60)
Globulin: 2.3 g/dL
Glucose: 117 mg/dL — ABNORMAL HIGH (ref 70–99)
Potassium: 4.8 mmol/L (ref 3.5–5.1)
Sodium: 142 mmol/L (ref 136–145)
Total Bilirubin: 0.6 mg/dL (ref 0.0–1.0)
Total Protein: 6.5 g/dL (ref 6.4–8.2)

## 2015-11-17 LAB — LIPID PANEL
Cholesterol, Total: 161 mg/dL (ref 0–199)
HDL: 70 mg/dL — ABNORMAL HIGH (ref 40–60)
LDL Calculated: 81 mg/dL (ref <100)
Triglycerides: 52 mg/dL (ref 0–150)
VLDL Cholesterol Calculated: 10 mg/dL

## 2015-11-17 NOTE — Telephone Encounter (Signed)
Please sign labs.

## 2015-11-18 LAB — HEMOGLOBIN A1C
Hemoglobin A1C: 7.4 %
eAG: 165.7 mg/dL

## 2015-11-22 ENCOUNTER — Ambulatory Visit: Admit: 2015-11-22 | Discharge: 2015-11-22 | Payer: PRIVATE HEALTH INSURANCE | Attending: Internal Medicine

## 2015-11-22 DIAGNOSIS — Z794 Long term (current) use of insulin: Secondary | ICD-10-CM

## 2015-11-22 MED ORDER — SILDENAFIL CITRATE 100 MG PO TABS
100 | ORAL_TABLET | ORAL | 5 refills | Status: DC | PRN
Start: 2015-11-22 — End: 2017-05-02

## 2015-11-22 MED ORDER — SILDENAFIL CITRATE 100 MG PO TABS
100 | ORAL_TABLET | ORAL | 0 refills | Status: DC | PRN
Start: 2015-11-22 — End: 2016-05-07

## 2015-11-22 MED ORDER — SILDENAFIL CITRATE 100 MG PO TABS
100 | ORAL_TABLET | ORAL | 5 refills | Status: DC | PRN
Start: 2015-11-22 — End: 2015-11-22

## 2015-11-22 MED ORDER — BETAMETHASONE DIPROPIONATE AUG 0.05 % EX CREA
0.05 | CUTANEOUS | 1 refills | Status: AC
Start: 2015-11-22 — End: 2015-12-22

## 2015-11-22 MED ORDER — BETAMETHASONE DIPROPIONATE AUG 0.05 % EX CREA
0.05 % | CUTANEOUS | 1 refills | Status: DC
Start: 2015-11-22 — End: 2015-11-22

## 2015-11-22 NOTE — Progress Notes (Signed)
Subjective:      Patient ID: Jeremiah Guzman is a 55 y.o. male.    Patient presents with:  6 Month Follow-Up: diabetes follow up, discuss labs.   Flu Vaccine    Jeremiah Guzman is a 55 y.o. male with the following history as recorded in EpicCare:  Patient Active Problem List    Diabetes type 1, uncontrolled ([place])      Cervical lymphadenopathy         Date Noted: 12/18/2011      Type 1 diabetes mellitus, uncontrolled ([place])         Date Noted: 04/10/2011      Jaw swelling         Date Noted: 02/25/2010      Jaw pain         Date Noted: 02/25/2010      Cancer of skin      Obesity         Date Noted: 03/27/2008      Hyperlipidemia         Date Noted: 03/27/2008      HTN (hypertension)         Date Noted: 03/27/2008      Current Outpatient Prescriptions:  insulin detemir (LEVEMIR FLEXTOUCH) 100 UNIT/ML injection pen, Inject 60 Units into the skin nightly, Disp: 15 Pen, Rfl: 3  glucose monitoring kit (FREESTYLE) monitoring kit, 1 kit by Does not apply route daily as needed (test daily), Disp: 1 kit, Rfl: 0  Lancets MISC, Test daily., Disp: 100 each, Rfl: 3  Glucose Blood (BLOOD GLUCOSE TEST STRIPS) STRP, Test qd, Disp: 100 strip, Rfl: 3  lisinopril (PRINIVIL;ZESTRIL) 10 MG tablet, TAKE 1 TABLET BY MOUTH DAILY, Disp: 90 tablet, Rfl: 3  sitaGLIPtan-metformin (JANUMET) 50-500 MG per tablet, Take 1 tablet by mouth 2 times daily (with meals), Disp: 180 tablet, Rfl: 3  simvastatin (ZOCOR) 40 MG tablet, TAKE 1 TABLET BY MOUTH DAILY, Disp: 90 tablet, Rfl: 3  Insulin Pen Needle (B-D ULTRAFINE III SHORT PEN) 31G X 8 MM MISC, Patient test BIDDx 250.00, Disp: 300 each, Rfl: 3  sildenafil (VIAGRA) 100 MG tablet, Take 1 tablet by mouth as needed for Erectile Dysfunction, Disp: 2 tablet, Rfl: 3  Insulin Syringe-Needle U-100 31G X 5/16" 1 ML MISC, 1 each by Does not apply route daily, Disp: 100 each, Rfl: 5  insulin lispro (HUMALOG) 100 UNIT/ML injection vial, Inject 10 Units into the skin 3 times daily (before meals), Disp: 3 vial, Rfl:  1  Accu-Chek Softclix Lancets MISC, Test TID, DX: 250.03, Accu-Chek Aviva, Disp: 200 each, Rfl: 3  Blood Glucose Monitoring Suppl (ACCU-CHEK AVIVA PLUS) W/DEVICE KIT, DX: 250.03, Disp: 1 kit, Rfl: 0  glucose blood VI test strips (ACCU-CHEK AVIVA PLUS) strip, Test TID, DX: 250.03, Disp: 200 each, Rfl: 3  Multiple Vitamin TABS, Take  by mouth daily., Disp: , Rfl:   aspirin 81 MG tablet, Take 81 mg by mouth daily., Disp: , Rfl:     No current facility-administered medications for this visit.     Allergies: Review of patient's allergies indicates no known allergies.  Past Medical History:  08/2013: Basal cell carcinoma      Comment: left nose  No date: Cancer of skin  No date: Diabetes type 1, uncontrolled ([place])  No date: Erectile dysfunction  03/27/2008: HBP (high blood pressure)  03/27/2008: Hyperlipidemia  03/27/2008: Obesity  Past Surgical History:  01-06-2007: COLONOSCOPY      Comment: neg Dr. Beatrix Shipper  08/2013: SKIN  CANCER EXCISION Left      Comment: nose  No date: TONSILLECTOMY  Review of patient's family history indicates:    Cancer                         Mother                      Comment: colon    Cancer                         Father                      Comment: skin    Asthma                         Sister                    Diabetes                       Paternal Grandmother      Heart Attack                   Maternal Grandfather      Smoking status: Never Smoker                                                              Smokeless tobacco: Former Systems developer                        Quit date: 01/25/2010  Alcohol use: Yes              Comment: weekends        Diabetes   He presents for his follow-up diabetic visit. He has type 2 diabetes mellitus. The initial diagnosis of diabetes was made 15 years ago. There are no hypoglycemic associated symptoms. There are no diabetic associated symptoms. Pertinent negatives for diabetes include no fatigue. There are no hypoglycemic complications. Diabetic complications include  peripheral neuropathy. Risk factors for coronary artery disease include dyslipidemia, male sex and obesity. Current diabetic treatment includes insulin injections and oral agent (dual therapy). His weight is stable. He participates in exercise three times a week. His breakfast blood glucose range is generally 110-130 mg/dl. An ACE inhibitor/angiotensin II receptor blocker is being taken. Eye exam is current.       Review of Systems   Constitutional: Negative for fatigue.   HENT: Negative.    Eyes: Negative for visual disturbance.   Respiratory: Negative.    Cardiovascular: Negative.    Gastrointestinal: Negative.    Genitourinary: Negative.    Musculoskeletal: Positive for arthralgias.        Knee and elbow pain.   Neurological: Positive for numbness (feet.).   Psychiatric/Behavioral: Negative.        Objective:   Physical Exam   Constitutional: He appears well-developed and well-nourished.   HENT:   Mouth/Throat: No oropharyngeal exudate.   Eyes: Conjunctivae and EOM are normal. Pupils are equal, round, and reactive to light. No scleral icterus.   Neck: Normal range of motion. No thyromegaly present.   Cardiovascular:  Normal rate, regular rhythm and intact distal pulses.    Pulmonary/Chest: Effort normal. He has no wheezes. He exhibits no tenderness.   Abdominal: Soft. He exhibits no mass. There is no tenderness.   Musculoskeletal: Normal range of motion. He exhibits no edema.   Lymphadenopathy:     He has no cervical adenopathy.   Neurological: He is alert. No cranial nerve deficit.   Skin: Skin is warm.   Psychiatric: His behavior is normal.       Assessment:      Encounter Diagnoses   Name Primary?   ??? Diabetes mellitus due to underlying condition, controlled, with complication, with long-term current use of insulin ([place]) Yes   ??? Need for influenza vaccination    ??? Other post-procedural erectile dysfunction    ??? Pure hypercholesterolemia    ??? Essential hypertension            Plan:      Danel was seen today for  6 month follow-up and flu vaccine.    Diagnoses and all orders for this visit:    Diabetes mellitus due to underlying condition, controlled, with complication, with long-term current use of insulin ([place])    Need for influenza vaccination  -     INFLUENZA, QUADV, 3 YRS AND OLDER, IM, PF, PREFILL SYR OR SDV, 0.5ML (FLUZONE QUADV, PF)    Other post-procedural erectile dysfunction    Pure hypercholesterolemia    Essential hypertension    Other orders  -     augmented betamethasone dipropionate (DIPROLENE AF) 0.05 % cream; Apply topically daily.  -     sildenafil (VIAGRA) 100 MG tablet; Take 1 tablet by mouth as needed for Erectile Dysfunction  -     sildenafil (VIAGRA) 100 MG tablet; Take 1 tablet by mouth as needed for Erectile Dysfunction

## 2015-11-22 NOTE — Patient Instructions (Addendum)
Please call your pharmacy if you need any refills of your medication(s).    Please call our office at [phone] if you don't hear from us about your test results.                                                                 Bring an accurate list of your medications with you at every appointment to ensure that we have the correct information.    Our office hours are: Monday - Friday 8:30 am- 5 pm    Phone lines turn on at 8:30 am    Patient Self-Management Goal for Chronic Condition  Goal: I will take all medications as prescribed by my doctor, and I will call the office if I am having any medication problems.  Barriers to success: none  Plan for overcoming my barriers: N/A     Confidence: 10/10  Date goal set: 11/22/15  Date goal attained:   Vaccine Information Sheet, "Influenza - Inactivated"  given to Consuello MasseJames W Hintze, or parent/legal guardian of  Consuello MasseJames W Hurston and verbalized understanding.    Patient responses:    Have you ever had a reaction to a flu vaccine? No  Are you able to eat eggs without adverse effects?  Yes  Do you have any current illness?  No  Have you ever had Guillian Barre Syndrome?  No    Flu vaccine given per order. Please see immunization tab.

## 2016-02-16 ENCOUNTER — Telehealth

## 2016-02-16 MED ORDER — INSULIN GLARGINE 100 UNIT/ML SC SOPN
100 UNIT/ML | PEN_INJECTOR | Freq: Every evening | SUBCUTANEOUS | 3 refills | Status: DC
Start: 2016-02-16 — End: 2016-10-22

## 2016-02-16 MED ORDER — INSULIN LISPRO 100 UNIT/ML SC SOLN
100 | Freq: Three times a day (TID) | SUBCUTANEOUS | 1 refills | Status: DC
Start: 2016-02-16 — End: 2016-02-23

## 2016-02-16 NOTE — Telephone Encounter (Signed)
Please advise on refill and also changing levemir to lantus.  [facility]

## 2016-02-16 NOTE — Telephone Encounter (Signed)
Patient calling because his  Insurance does not cover  insulin detemir (LEVEMIR FLEXTOUCH) 100 UNIT/ML injection pen and he would like to go back on insulin glargine (LANTUS SOLOSTAR) 100 UNIT/ML injection pen. He would also like to have insulin lispro (HUMALOG) 100 UNIT/ML injection pen filled. [facility]

## 2016-02-16 NOTE — Telephone Encounter (Signed)
Changed med and ordered it.

## 2016-02-22 NOTE — Telephone Encounter (Signed)
Pt needs a new rx on humalog pens. Per pt he was given a rx for insulin lispro (HUMALOG) 100 UNIT/ML injection vial but what he needs is the humalog pens. Pt [facility]. Pt only has a 1/2 pen left. Pt is upset b/c he got billed $75 for the insulin lispro (HUMALOG) 100 UNIT/ML injection vial.

## 2016-02-23 MED ORDER — INSULIN LISPRO 100 UNIT/ML SC SOPN
100 UNIT/ML | PEN_INJECTOR | Freq: Three times a day (TID) | SUBCUTANEOUS | 3 refills | Status: DC
Start: 2016-02-23 — End: 2017-08-09

## 2016-02-23 NOTE — Telephone Encounter (Signed)
Please see message below. Pt is asking for humalog pens, not vial. [facility].

## 2016-05-07 ENCOUNTER — Ambulatory Visit: Admit: 2016-05-07 | Discharge: 2016-05-07 | Payer: PRIVATE HEALTH INSURANCE | Attending: Internal Medicine

## 2016-05-07 DIAGNOSIS — J069 Acute upper respiratory infection, unspecified: Secondary | ICD-10-CM

## 2016-05-07 MED ORDER — LANCETS MISC
3 refills | Status: DC
Start: 2016-05-07 — End: 2016-05-18

## 2016-05-07 MED ORDER — CEFUROXIME AXETIL 500 MG PO TABS
500 MG | ORAL_TABLET | Freq: Two times a day (BID) | ORAL | 0 refills | Status: AC
Start: 2016-05-07 — End: 2016-05-14

## 2016-05-07 MED ORDER — BLOOD GLUCOSE TEST VI STRP
ORAL_STRIP | 3 refills | Status: DC
Start: 2016-05-07 — End: 2017-05-02

## 2016-05-07 NOTE — Patient Instructions (Signed)
Please call your pharmacy if you need any refills of your medication(s).    Please call our office at [phone] if you don't hear from us about your test results.                                                                 Bring an accurate list of your medications with you at every appointment to ensure that we have the correct information.    Our office hours are: Monday - Friday 8:30 am- 5 pm    Phone lines turn on at 8:30 am

## 2016-05-07 NOTE — Progress Notes (Signed)
Subjective:      Patient ID: Jeremiah Guzman is a 56 y.o. male.    Patient presents with:  Pharyngitis: sore throat, 2 wks.  tingling on the right side of his head off and on, some dizziness, about a week.  he wonders if it has anything to do with his jaw on right being bigger? any ex's today need to be written out, not sure of local pharm on his plan.      Jeremiah Guzman is a 56 y.o. male with the following history as recorded in EpicCare:  Patient Active Problem List    Diabetes type 1, uncontrolled ([place])      Cervical lymphadenopathy         Date Noted: 12/18/2011      Type 1 diabetes mellitus, uncontrolled ([place])         Date Noted: 04/10/2011      Jaw swelling         Date Noted: 02/25/2010      Jaw pain         Date Noted: 02/25/2010      Cancer of skin      Obesity         Date Noted: 03/27/2008      Hyperlipidemia         Date Noted: 03/27/2008      HTN (hypertension)         Date Noted: 03/27/2008      Current Outpatient Prescriptions:  Lancets MISC, Test TID with Accu-check, Disp: 300 each, Rfl: 3  Glucose Blood (BLOOD GLUCOSE TEST STRIPS) STRP, Test TID with Accu-chek glucometer., Disp: 300 strip, Rfl: 3  insulin lispro (HUMALOG KWIKPEN) 100 UNIT/ML pen, Inject 10 Units into the skin 3 times daily (before meals), Disp: 5 pen, Rfl: 3  insulin glargine (LANTUS SOLOSTAR) 100 UNIT/ML injection pen, Inject 60 Units into the skin nightly, Disp: 18 pen, Rfl: 3  sildenafil (VIAGRA) 100 MG tablet, Take 1 tablet by mouth as needed for Erectile Dysfunction, Disp: 10 tablet, Rfl: 5  glucose monitoring kit (FREESTYLE) monitoring kit, 1 kit by Does not apply route daily as needed (test daily), Disp: 1 kit, Rfl: 0  Lancets MISC, Test daily., Disp: 100 each, Rfl: 3  Glucose Blood (BLOOD GLUCOSE TEST STRIPS) STRP, Test qd, Disp: 100 strip, Rfl: 3  lisinopril (PRINIVIL;ZESTRIL) 10 MG tablet, TAKE 1 TABLET BY MOUTH DAILY, Disp: 90 tablet, Rfl: 3  sitaGLIPtan-metformin (JANUMET) 50-500 MG per tablet, Take 1 tablet by mouth 2  times daily (with meals), Disp: 180 tablet, Rfl: 3  simvastatin (ZOCOR) 40 MG tablet, TAKE 1 TABLET BY MOUTH DAILY, Disp: 90 tablet, Rfl: 3  Insulin Pen Needle (B-D ULTRAFINE III SHORT PEN) 31G X 8 MM MISC, Patient test BIDDx 250.00, Disp: 300 each, Rfl: 3  Insulin Syringe-Needle U-100 31G X 5/16" 1 ML MISC, 1 each by Does not apply route daily, Disp: 100 each, Rfl: 5  Accu-Chek Softclix Lancets MISC, Test TID, DX: 250.03, Accu-Chek Aviva, Disp: 200 each, Rfl: 3  Blood Glucose Monitoring Suppl (ACCU-CHEK AVIVA PLUS) W/DEVICE KIT, DX: 250.03, Disp: 1 kit, Rfl: 0  glucose blood VI test strips (ACCU-CHEK AVIVA PLUS) strip, Test TID, DX: 250.03, Disp: 200 each, Rfl: 3  Multiple Vitamin TABS, Take  by mouth daily., Disp: , Rfl:   aspirin 81 MG tablet, Take 81 mg by mouth daily., Disp: , Rfl:     No current facility-administered medications for this visit.     Allergies: Patient has  no known allergies.  Past Medical History:  08/2013: Basal cell carcinoma      Comment: left nose  No date: Cancer of skin  No date: Diabetes type 1, uncontrolled ([place])  No date: Erectile dysfunction  03/27/2008: HBP (high blood pressure)  03/27/2008: Hyperlipidemia  03/27/2008: Obesity  Past Surgical History:  01-06-2007: COLONOSCOPY      Comment: neg Dr. Beatrix Shipper  08/2013: SKIN CANCER EXCISION Left      Comment: nose  No date: TONSILLECTOMY  Review of patient's family history indicates:    Cancer                         Mother                      Comment: colon    Cancer                         Father                      Comment: skin    Asthma                         Sister                    Diabetes                       Paternal Grandmother      Heart Attack                   Maternal Grandfather      Smoking status: Never Smoker                                                              Smokeless tobacco: Former Systems developer                        Quit date: 01/25/2010  Alcohol use: Yes              Comment: weekends        Pharyngitis   This is  a new problem. The current episode started 1 to 4 weeks ago. The problem occurs constantly. The problem has been unchanged. Associated symptoms include arthralgias, coughing, numbness (both feet,) and a sore throat. Pertinent negatives include no anorexia, chills or fever. Nothing aggravates the symptoms. He has tried nothing for the symptoms. The treatment provided no relief.       Review of Systems   Constitutional: Negative for chills and fever.   HENT: Positive for sore throat. Negative for postnasal drip.    Eyes: Negative.    Respiratory: Positive for cough.    Gastrointestinal: Negative.  Negative for anorexia.   Musculoskeletal: Positive for arthralgias.   Skin: Negative.    Neurological: Positive for numbness (both feet,).   Psychiatric/Behavioral: Negative.        Objective:   Physical Exam   Constitutional: He appears well-developed and well-nourished.   HENT:   Mouth/Throat: No oropharyngeal exudate.   Eyes: Conjunctivae and EOM are normal. Pupils are equal, round, and reactive  to light. No scleral icterus.   Neck: Normal range of motion. No thyromegaly present.   Cardiovascular: Normal rate, regular rhythm and intact distal pulses.    Pulmonary/Chest: Effort normal. He has no wheezes. He exhibits no tenderness.   Abdominal: Soft. He exhibits no mass. There is no tenderness.   Musculoskeletal: Normal range of motion. He exhibits no edema.   Lymphadenopathy:     He has no cervical adenopathy.   Neurological: He is alert. No cranial nerve deficit.   Skin: Skin is warm.   Psychiatric: His behavior is normal.       Assessment:      Encounter Diagnoses   Name Primary?   . Acute URI Yes   . Uncontrolled type 1 diabetes with diabetic neuropathy ([place])            Plan:     Jeremiah Guzman was seen today for pharyngitis.    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

## 2016-05-18 MED ORDER — GLUCOSE BLOOD VI STRP
Freq: Three times a day (TID) | 3 refills | Status: DC
Start: 2016-05-18 — End: 2017-08-23

## 2016-05-18 MED ORDER — LANCETS MISC
3 refills | Status: AC
Start: 2016-05-18 — End: ?

## 2016-05-18 MED ORDER — FREESTYLE SYSTEM KIT
PACK | Freq: Three times a day (TID) | 0 refills | Status: AC
Start: 2016-05-18 — End: ?

## 2016-05-18 NOTE — Telephone Encounter (Signed)
[facility]

## 2016-05-18 NOTE — Telephone Encounter (Signed)
Pt needs a new rx's for freestyle glucose meter,test strips, lancets and the "clicker" that the lancets go in. [facility].

## 2016-07-09 MED ORDER — LISINOPRIL 10 MG PO TABS
10 MG | ORAL_TABLET | ORAL | 3 refills | Status: DC
Start: 2016-07-09 — End: 2017-05-02

## 2016-07-09 MED ORDER — JANUMET 50-500 MG PO TABS
50-500 MG | ORAL_TABLET | ORAL | 3 refills | Status: DC
Start: 2016-07-09 — End: 2017-05-02

## 2016-07-09 MED ORDER — SIMVASTATIN 40 MG PO TABS
40 MG | ORAL_TABLET | ORAL | 3 refills | Status: DC
Start: 2016-07-09 — End: 2017-05-02

## 2016-10-22 MED ORDER — LANTUS SOLOSTAR 100 UNIT/ML SC SOPN
100 UNIT/ML | SUBCUTANEOUS | 2 refills | Status: DC
Start: 2016-10-22 — End: 2017-05-02

## 2016-11-15 ENCOUNTER — Encounter: Admit: 2016-11-15 | Discharge: 2016-11-15 | Payer: PRIVATE HEALTH INSURANCE

## 2016-11-15 DIAGNOSIS — Z23 Encounter for immunization: Secondary | ICD-10-CM

## 2016-11-15 NOTE — Progress Notes (Signed)
Vaccine Information Sheet, "Influenza - Inactivated"  given to Jeremiah Guzman, or parent/legal guardian of  Jeremiah Guzman and verbalized understanding.    Patient responses:    Have you ever had a reaction to a flu vaccine? No  Are you able to eat eggs without adverse effects?  Yes  Do you have any current illness?  No  Have you ever had Guillian Barre Syndrome?  No    Flu vaccine given per order. Please see immunization tab.

## 2017-04-17 ENCOUNTER — Telehealth

## 2017-04-17 NOTE — Telephone Encounter (Signed)
Pt is scheduled on 04/22/2017, preferred to get blood work ahead of time.    Requesting blood work

## 2017-04-17 NOTE — Telephone Encounter (Signed)
Sent to wrong office

## 2017-04-17 NOTE — Telephone Encounter (Signed)
Pt has a checkup appt scheduled and he needs bw orders placed in the computer system for the [facility]. Pl advise pt when orders are available.

## 2017-04-18 ENCOUNTER — Encounter

## 2017-04-18 NOTE — Telephone Encounter (Signed)
Please sign labs.

## 2017-04-19 ENCOUNTER — Encounter: Attending: Internal Medicine

## 2017-04-22 ENCOUNTER — Encounter: Attending: Internal Medicine

## 2017-04-29 ENCOUNTER — Encounter

## 2017-04-29 LAB — COMPREHENSIVE METABOLIC PANEL
ALT: 46 U/L — ABNORMAL HIGH (ref 10–40)
AST: 35 U/L (ref 15–37)
Albumin/Globulin Ratio: 1.3 (ref 1.1–2.2)
Albumin: 4.1 g/dL (ref 3.4–5.0)
Alkaline Phosphatase: 63 U/L (ref 40–129)
Anion Gap: 13 (ref 3–16)
BUN: 13 mg/dL (ref 7–20)
CO2: 26 mmol/L (ref 21–32)
Calcium: 9.2 mg/dL (ref 8.3–10.6)
Chloride: 101 mmol/L (ref 99–110)
Creatinine: 0.8 mg/dL — ABNORMAL LOW (ref 0.9–1.3)
GFR African American: >60 (ref >60)
GFR Non-African American: >60 (ref >60)
Globulin: 3.2 g/dL
Glucose: 129 mg/dL — ABNORMAL HIGH (ref 70–99)
Potassium: 4.7 mmol/L (ref 3.5–5.1)
Sodium: 140 mmol/L (ref 136–145)
Total Bilirubin: 0.6 mg/dL (ref 0.0–1.0)
Total Protein: 7.3 g/dL (ref 6.4–8.2)

## 2017-04-29 LAB — CBC
Hematocrit: 46.6 % (ref 40.5–52.5)
Hemoglobin: 15.7 g/dL (ref 13.5–17.5)
MCH: 33.4 pg (ref 26.0–34.0)
MCHC: 33.8 g/dL (ref 31.0–36.0)
MCV: 98.7 fL (ref 80.0–100.0)
MPV: 9.2 fL (ref 5.0–10.5)
Platelets: 193 10*3/uL (ref 135–450)
RBC: 4.72 M/uL (ref 4.20–5.90)
RDW: 11.9 % — ABNORMAL LOW (ref 12.4–15.4)
WBC: 7.5 10*3/uL (ref 4.0–11.0)

## 2017-04-29 LAB — LIPID PANEL
Cholesterol, Total: 155 mg/dL (ref 0–199)
HDL: 44 mg/dL (ref 40–60)
LDL Calculated: 85 mg/dL (ref <100)
Triglycerides: 131 mg/dL (ref 0–150)
VLDL Cholesterol Calculated: 26 mg/dL

## 2017-04-29 LAB — PSA SCREENING: PSA: 0.97 ng/mL (ref 0.00–4.00)

## 2017-04-30 LAB — HEMOGLOBIN A1C
Hemoglobin A1C: 9.1 %
eAG: 214.5 mg/dL

## 2017-05-02 ENCOUNTER — Ambulatory Visit: Admit: 2017-05-02 | Discharge: 2017-05-02 | Payer: PRIVATE HEALTH INSURANCE | Attending: Internal Medicine

## 2017-05-02 DIAGNOSIS — IMO0002 Reserved for concepts with insufficient information to code with codable children: Secondary | ICD-10-CM

## 2017-05-02 MED ORDER — LISINOPRIL 10 MG PO TABS
10 MG | ORAL_TABLET | Freq: Every day | ORAL | 3 refills | Status: DC
Start: 2017-05-02 — End: 2018-05-26

## 2017-05-02 MED ORDER — INSULIN GLARGINE 100 UNIT/ML SC SOPN
100 UNIT/ML | PEN_INJECTOR | Freq: Two times a day (BID) | SUBCUTANEOUS | 3 refills | Status: DC
Start: 2017-05-02 — End: 2018-06-27

## 2017-05-02 MED ORDER — SIMVASTATIN 40 MG PO TABS
40 MG | ORAL_TABLET | Freq: Every evening | ORAL | 3 refills | Status: DC
Start: 2017-05-02 — End: 2018-11-07

## 2017-05-02 MED ORDER — SILDENAFIL CITRATE 100 MG PO TABS
100 | ORAL_TABLET | ORAL | 5 refills | Status: DC | PRN
Start: 2017-05-02 — End: 2017-11-01

## 2017-05-02 MED ORDER — NAPROXEN 500 MG PO TABS
500 MG | ORAL_TABLET | Freq: Two times a day (BID) | ORAL | 0 refills | Status: DC
Start: 2017-05-02 — End: 2017-07-22

## 2017-05-02 MED ORDER — SITAGLIPTIN-METFORMIN HCL 50-500 MG PO TABS
50-500 MG | ORAL_TABLET | Freq: Two times a day (BID) | ORAL | 3 refills | Status: DC
Start: 2017-05-02 — End: 2018-05-26

## 2017-05-02 NOTE — Patient Instructions (Signed)
Please call your pharmacy if you need any refills of your medication(s).    Please call our office at [phone] if you don't hear from us about your test results.                                                                 Bring an accurate list of your medications with you at every appointment to ensure that we have the correct information.    Our office hours are: Monday - Friday 8:30 am- 5 pm    Phone lines turn on at 8:30 am

## 2017-05-02 NOTE — Progress Notes (Signed)
Subjective:      Patient ID: Jeremiah Guzman is a 57 y.o. male.    Patient presents with:  Check-Up: discuss labs, due for FIT or colonoscopy, also c/o left arm/hand falling asleep and tingling. ? carpal tunnel?    Jeremiah Guzman is a 56 y.o. male with the following history as recorded in EpicCare:  Patient Active Problem List    Diabetes type 1, uncontrolled ([place])      Cervical lymphadenopathy         Date Noted: 12/18/2011      Type 1 diabetes mellitus, uncontrolled ([place])         Date Noted: 04/10/2011      Jaw swelling         Date Noted: 02/25/2010      Jaw pain         Date Noted: 02/25/2010      Cancer of skin      Obesity         Date Noted: 03/27/2008      Hyperlipidemia         Date Noted: 03/27/2008      HTN (hypertension)         Date Noted: 03/27/2008      Current Outpatient Medications:  naproxen (NAPROSYN) 500 MG tablet, Take 1 tablet by mouth 2 times daily (with meals), Disp: 180 tablet, Rfl: 0  sitaGLIPtan-metformin (JANUMET) 50-500 MG per tablet, Take 1 tablet by mouth 2 times daily (with meals), Disp: 180 tablet, Rfl: 3  insulin glargine (LANTUS SOLOSTAR) 100 UNIT/ML injection pen, Inject 40 Units into the skin 2 times daily, Disp: 21 pen, Rfl: 3  lisinopril (PRINIVIL;ZESTRIL) 10 MG tablet, Take 1 tablet by mouth daily, Disp: 90 tablet, Rfl: 3  sildenafil (VIAGRA) 100 MG tablet, Take 1 tablet by mouth as needed for Erectile Dysfunction, Disp: 10 tablet, Rfl: 5  simvastatin (ZOCOR) 40 MG tablet, Take 1 tablet by mouth nightly, Disp: 90 tablet, Rfl: 3  glucose monitoring kit (FREESTYLE) monitoring kit, 1 kit by Does not apply route three times daily, Disp: 1 kit, Rfl: 0  glucose blood VI test strips (FREESTYLE TEST STRIPS) strip, 1 each by In Vitro route 3 times daily As needed., Disp: 300 each, Rfl: 3  Lancets MISC, Test TID with freestyle meter, Disp: 300 each, Rfl: 3  insulin lispro (HUMALOG KWIKPEN) 100 UNIT/ML pen, Inject 10 Units into the skin 3 times daily (before meals), Disp: 5 pen, Rfl:  3  Insulin Pen Needle (B-D ULTRAFINE III SHORT PEN) 31G X 8 MM MISC, Patient test BIDDx 250.00, Disp: 300 each, Rfl: 3  Insulin Syringe-Needle U-100 31G X 5/16" 1 ML MISC, 1 each by Does not apply route daily, Disp: 100 each, Rfl: 5  Multiple Vitamin TABS, Take  by mouth daily., Disp: , Rfl:   aspirin 81 MG tablet, Take 81 mg by mouth daily., Disp: , Rfl:     No current facility-administered medications for this visit.     Allergies: Patient has no known allergies.  Past Medical History:  08/2013: Basal cell carcinoma      Comment:  left nose  No date: Cancer of skin  No date: Diabetes type 1, uncontrolled ([place])  No date: Erectile dysfunction  03/27/2008: HBP (high blood pressure)  03/27/2008: Hyperlipidemia  03/27/2008: Obesity  Past Surgical History:  01-06-2007: COLONOSCOPY      Comment:  neg Dr. Beatrix Shipper  08/2013: SKIN CANCER EXCISION; Left      Comment:  nose  No date: TONSILLECTOMY  Review of patient's family history indicates:  Problem: Cancer      Relation: Mother          Age of Onset: (Not Specified)          Comment: colon  Problem: Cancer      Relation: Father          Age of Onset: (Not Specified)          Comment: skin  Problem: Asthma      Relation: Sister          Age of Onset: (Not Specified)  Problem: Diabetes      Relation: Paternal Grandmother          Age of Onset: (Not Specified)  Problem: Heart Attack      Relation: Maternal Grandfather          Age of Onset: 22    Social History    Tobacco Use      Smoking status: Never Smoker      Smokeless tobacco: Former Systems developer    Alcohol use: Yes      Comment: weekends      Diabetes   He presents for his follow-up diabetic visit. He has type 2 diabetes mellitus. His disease course has been fluctuating. There are no hypoglycemic associated symptoms. There are no diabetic associated symptoms. Symptoms are stable. Risk factors for coronary artery disease include dyslipidemia, diabetes mellitus, male sex and obesity. His weight is stable. He rarely participates in  exercise. His breakfast blood glucose range is generally 130-140 mg/dl. An ACE inhibitor/angiotensin II receptor blocker is being taken. Eye exam is current.       Review of Systems   Musculoskeletal:        Shoulder and foot pain   Neurological:        Lt hand is numb.       Objective:   Physical Exam   Constitutional: He appears well-developed and well-nourished.   HENT:   Head: Normocephalic and atraumatic.   Right Ear: External ear normal.   Left Ear: External ear normal.   Nose: Nose normal.   Mouth/Throat: Oropharynx is clear and moist.   Eyes: Conjunctivae are normal. Right eye exhibits no discharge. Left eye exhibits no discharge. No scleral icterus.   Neck: Normal range of motion. Neck supple. No JVD present. No tracheal deviation present. No thyromegaly present.   Cardiovascular: Normal rate, regular rhythm, normal heart sounds and intact distal pulses. Exam reveals no gallop and no friction rub.   No murmur heard.  Pulmonary/Chest: Effort normal and breath sounds normal. No stridor. No respiratory distress. He has no wheezes. He has no rales. He exhibits no tenderness.   Abdominal: Soft. Bowel sounds are normal. He exhibits no distension and no mass. There is no tenderness. There is no rebound and no guarding.   Genitourinary: Rectum normal, prostate normal and penis normal. Rectal exam shows guaiac negative stool. No penile tenderness.   Musculoskeletal: He exhibits no edema.        Right shoulder: He exhibits decreased range of motion, tenderness and bony tenderness.   Lymphadenopathy:     He has no cervical adenopathy.   Neurological: He has normal reflexes. He displays normal reflexes. No cranial nerve deficit. He exhibits normal muscle tone. Coordination normal.   Lt hand is numb.   Skin: Skin is warm and dry. No rash noted. No erythema. No pallor.   Psychiatric: He has a normal mood and  affect. His behavior is normal. Judgment and thought content normal.       Assessment:      Encounter Diagnoses    Name Primary?   ??? 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 Acute pain of right shoulder    ??? Carpal tunnel syndrome of left wrist    ??? Pure hypercholesterolemia    ??? Other male erectile dysfunction            Plan:      Gaines was seen today for check-up.    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

## 2017-07-22 ENCOUNTER — Encounter

## 2017-07-22 MED ORDER — NAPROXEN 500 MG PO TABS
500 MG | ORAL_TABLET | Freq: Two times a day (BID) | ORAL | 1 refills | Status: DC
Start: 2017-07-22 — End: 2018-09-25

## 2017-08-09 MED ORDER — INSULIN LISPRO 100 UNIT/ML SC SOPN
100 UNIT/ML | PEN_INJECTOR | Freq: Three times a day (TID) | SUBCUTANEOUS | 3 refills | Status: AC
Start: 2017-08-09 — End: ?

## 2017-08-09 NOTE — Telephone Encounter (Signed)
Pt calling b/c he needs 2 rx's on insulin lispro (HUMALOG KWIKPEN) 100 UNIT/ML pen. Pt needs a 30 day supply of the med to go to [facility] in [place] and he needs a 90 day supply w/ a year of refills [facility]

## 2017-08-23 MED ORDER — GLUCOSE BLOOD VI STRP
Freq: Three times a day (TID) | 3 refills | Status: DC
Start: 2017-08-23 — End: 2018-08-04

## 2017-08-23 NOTE — Telephone Encounter (Signed)
Patient requesting a medication refill.  Medication glucose blood VI test strips (FREESTYLE TEST STRIPS) strip   [facility] [place] - [address] Demetrius Charity- P [phone] Carmon Ginsberg- F [phone]  Pharmacy   Last office visit: 05/02/17  Next office visit: 11/01/2017

## 2017-10-28 ENCOUNTER — Telehealth

## 2017-10-28 NOTE — Telephone Encounter (Signed)
Orders done, pt informed.

## 2017-10-28 NOTE — Telephone Encounter (Signed)
Patient has his 6 month diabetes check on Oct 25 needs his blood work order put in. Let patient know when done.

## 2017-10-29 ENCOUNTER — Encounter

## 2017-10-29 LAB — COMPREHENSIVE METABOLIC PANEL
ALT: 38 U/L (ref 10–40)
AST: 33 U/L (ref 15–37)
Albumin/Globulin Ratio: 1.6 (ref 1.1–2.2)
Albumin: 4.2 g/dL (ref 3.4–5.0)
Alkaline Phosphatase: 49 U/L (ref 40–129)
Anion Gap: 13 (ref 3–16)
BUN: 13 mg/dL (ref 7–20)
CO2: 27 mmol/L (ref 21–32)
Calcium: 9.3 mg/dL (ref 8.3–10.6)
Chloride: 100 mmol/L (ref 99–110)
Creatinine: 0.9 mg/dL (ref 0.9–1.3)
GFR African American: >60 (ref >60)
GFR Non-African American: >60 (ref >60)
Globulin: 2.6 g/dL
Glucose: 92 mg/dL (ref 70–99)
Potassium: 4.4 mmol/L (ref 3.5–5.1)
Sodium: 140 mmol/L (ref 136–145)
Total Bilirubin: 0.5 mg/dL (ref 0.0–1.0)
Total Protein: 6.8 g/dL (ref 6.4–8.2)

## 2017-10-29 LAB — HEMOGLOBIN A1C
Hemoglobin A1C: 8 %
eAG: 182.9 mg/dL

## 2017-10-29 LAB — LIPID PANEL
Cholesterol, Total: 149 mg/dL (ref 0–199)
HDL: 59 mg/dL (ref 40–60)
LDL Calculated: 75 mg/dL (ref <100)
Triglycerides: 77 mg/dL (ref 0–150)
VLDL Cholesterol Calculated: 15 mg/dL

## 2017-11-01 ENCOUNTER — Ambulatory Visit: Admit: 2017-11-01 | Discharge: 2017-11-01 | Payer: PRIVATE HEALTH INSURANCE | Attending: Internal Medicine

## 2017-11-01 DIAGNOSIS — IMO0002 Reserved for concepts with insufficient information to code with codable children: Secondary | ICD-10-CM

## 2017-11-01 LAB — PSA SCREENING: PSA: 1.12 ng/mL (ref 0.00–4.00)

## 2017-11-01 MED ORDER — SILDENAFIL CITRATE 100 MG PO TABS
100 | ORAL_TABLET | ORAL | 3 refills | Status: DC | PRN
Start: 2017-11-01 — End: 2018-11-07

## 2017-11-01 MED ORDER — TAMSULOSIN HCL 0.4 MG PO CAPS
0.4 | ORAL_CAPSULE | Freq: Every day | ORAL | 3 refills | Status: DC
Start: 2017-11-01 — End: 2018-10-09

## 2017-11-01 MED ORDER — DOXYCYCLINE HYCLATE 100 MG PO CAPS
100 MG | ORAL_CAPSULE | Freq: Two times a day (BID) | ORAL | 0 refills | Status: AC
Start: 2017-11-01 — End: 2017-11-11

## 2017-11-01 NOTE — Progress Notes (Signed)
Vaccine Information Sheet, "Influenza - Inactivated"  given to Jeremiah Guzman, or parent/legal guardian of  Jeremiah Guzman and verbalized understanding.    Patient responses:    Have you ever had a reaction to a flu vaccine? No  Do you have any current illness?  No  Have you ever had Guillian Barre Syndrome?  No  Do you have a serious allergy to any of the follow: Neomycin, Polymyxin, Thimerosal, eggs or egg products? No    Flu vaccine given per order. Please see immunization tab.    Risks and benefits explained.  Current VIS given.

## 2017-11-01 NOTE — Patient Instructions (Signed)
Please call your pharmacy if you need any refills of your medication(s).    Please call our office at [phone] if you don't hear from us about your test results.                                                                 Bring an accurate list of your medications with you at every appointment to ensure that we have the correct information.    Our office hours are: Monday - Friday 8:30 am- 5 pm    Phone lines turn on at 8:30 am

## 2017-11-01 NOTE — Progress Notes (Signed)
Subjective:      Patient ID: Jeremiah Guzman is a 57 y.o. male.    Patient presents with:  Diabetes: 6 month check up , discuss labs    Jeremiah Guzman is a 57 y.o. male with the following history as recorded in [employer]:  Patient Active Problem List    Diabetes type 1, uncontrolled (HCC)      Cervical lymphadenopathy         Date Noted: 12/18/2011      Type 1 diabetes mellitus, uncontrolled (HCC)         Date Noted: 04/10/2011      Jaw swelling         Date Noted: 02/25/2010      Jaw pain         Date Noted: 02/25/2010      Cancer of skin      Obesity         Date Noted: 03/27/2008      Hyperlipidemia         Date Noted: 03/27/2008      HTN (hypertension)         Date Noted: 03/27/2008      Current Outpatient Medications:  tamsulosin (FLOMAX) 0.4 MG capsule, Take 1 capsule by mouth daily, Disp: 90 capsule, Rfl: 3  sildenafil (VIAGRA) 100 MG tablet, Take 1 tablet by mouth as needed for Erectile Dysfunction, Disp: 30 tablet, Rfl: 3  doxycycline hyclate (VIBRAMYCIN) 100 MG capsule, Take 1 capsule by mouth 2 times daily for 10 days, Disp: 20 capsule, Rfl: 0  blood glucose test strips (FREESTYLE TEST STRIPS) strip, 1 each by In Vitro route 3 times daily As needed., Disp: 300 each, Rfl: 3  insulin lispro (HUMALOG KWIKPEN) 100 UNIT/ML pen, Inject 10 Units into the skin 3 times daily (before meals), Disp: 15 pen, Rfl: 3  naproxen (NAPROSYN) 500 MG tablet, Take 1 tablet by mouth 2 times daily (with meals), Disp: 180 tablet, Rfl: 1  sitaGLIPtan-metformin (JANUMET) 50-500 MG per tablet, Take 1 tablet by mouth 2 times daily (with meals), Disp: 180 tablet, Rfl: 3  insulin glargine (LANTUS SOLOSTAR) 100 UNIT/ML injection pen, Inject 40 Units into the skin 2 times daily, Disp: 21 pen, Rfl: 3  lisinopril (PRINIVIL;ZESTRIL) 10 MG tablet, Take 1 tablet by mouth daily, Disp: 90 tablet, Rfl: 3  simvastatin (ZOCOR) 40 MG tablet, Take 1 tablet by mouth nightly, Disp: 90 tablet, Rfl: 3  glucose monitoring kit (FREESTYLE) monitoring kit, 1  kit by Does not apply route three times daily, Disp: 1 kit, Rfl: 0  Lancets MISC, Test TID with freestyle meter, Disp: 300 each, Rfl: 3  Insulin Pen Needle (B-D ULTRAFINE III SHORT PEN) 31G X 8 MM MISC, Patient test BIDDx 250.00, Disp: 300 each, Rfl: 3  Insulin Syringe-Needle U-100 31G X 5/16" 1 ML MISC, 1 each by Does not apply route daily, Disp: 100 each, Rfl: 5  Multiple Vitamin TABS, Take  by mouth daily., Disp: , Rfl:   aspirin 81 MG tablet, Take 81 mg by mouth daily., Disp: , Rfl:     No current facility-administered medications for this visit.     Allergies: Patient has no known allergies.  Past Medical History:  08/2013: Basal cell carcinoma      Comment:  left nose  No date: Cancer of skin  No date: Diabetes type 1, uncontrolled (HCC)  No date: Erectile dysfunction  03/27/2008: HBP (high blood pressure)  03/27/2008: Hyperlipidemia  03/27/2008: Obesity  Past Surgical History:  01-06-2007: COLONOSCOPY      Comment:  neg Dr. Alfonse Alpers  08/2013: SKIN CANCER EXCISION; Left      Comment:  nose  No date: TONSILLECTOMY  Review of patient's family history indicates:  Problem: Cancer      Relation: Mother          Age of Onset: (Not Specified)          Comment: colon  Problem: Cancer      Relation: Father          Age of Onset: (Not Specified)          Comment: skin  Problem: Asthma      Relation: Sister          Age of Onset: (Not Specified)  Problem: Diabetes      Relation: Paternal Grandmother          Age of Onset: (Not Specified)  Problem: Heart Attack      Relation: Maternal Grandfather          Age of Onset: 14    Social History    Tobacco Use      Smoking status: Never Smoker      Smokeless tobacco: Former Neurosurgeon    Alcohol use: Yes      Comment: weekends      Diabetes   He presents for his follow-up diabetic visit. He has type 2 diabetes mellitus. There are no hypoglycemic associated symptoms. Pertinent negatives for hypoglycemia include no confusion, dizziness, pallor, seizures or tremors. There are no diabetic  associated symptoms. Pertinent negatives for diabetes include no fatigue. Diabetic complications include peripheral neuropathy. Risk factors for coronary artery disease include male sex, dyslipidemia and obesity. Current diabetic treatment includes oral agent (dual therapy). He is compliant with treatment all of the time. His weight is stable. His breakfast blood glucose range is generally 140-180 mg/dl. An ACE inhibitor/angiotensin II receptor blocker is being taken. Eye exam is current.       Review of Systems   Constitutional: Negative.  Negative for fatigue.   HENT: Negative.  Negative for ear pain, hearing loss and sinus pressure.    Eyes: Negative.    Respiratory: Negative for apnea, choking and stridor.    Cardiovascular: Negative.    Gastrointestinal: Negative.    Genitourinary: Positive for frequency and urgency. Negative for discharge, hematuria, penile pain and penile swelling.        Nocturia 3-4x at night.   Musculoskeletal: Positive for arthralgias. Negative for back pain.   Skin: Negative for color change and pallor.   Neurological: Positive for numbness. Negative for dizziness, tremors, seizures and syncope.   Hematological: Does not bruise/bleed easily.   Psychiatric/Behavioral: Negative.  Negative for confusion, decreased concentration and dysphoric mood.       Objective:   Physical Exam   Constitutional: He appears well-developed and well-nourished.   HENT:   Head: Normocephalic and atraumatic.   Right Ear: External ear normal.   Left Ear: External ear normal.   Nose: Nose normal.   Mouth/Throat: Oropharynx is clear and moist.   Eyes: Conjunctivae are normal. Right eye exhibits no discharge. Left eye exhibits no discharge. No scleral icterus.   Neck: Normal range of motion. Neck supple. No JVD present. No tracheal deviation present. No thyromegaly present.   Cardiovascular: Normal rate, regular rhythm, normal heart sounds and intact distal pulses. Exam reveals no gallop and no friction rub.   No  murmur heard.  Pulmonary/Chest: Effort normal and breath  sounds normal. No stridor. No respiratory distress. He has no wheezes. He has no rales. He exhibits no tenderness.   Abdominal: Soft. Bowel sounds are normal. He exhibits no distension and no mass. There is no tenderness. There is no rebound and no guarding.   Genitourinary: Rectum normal, prostate normal and penis normal. Rectal exam shows guaiac negative stool. No penile tenderness.   Musculoskeletal: He exhibits no edema or tenderness.   Lymphadenopathy:     He has no cervical adenopathy.   Neurological: He has normal reflexes. He displays normal reflexes. No cranial nerve deficit. He exhibits normal muscle tone. Coordination normal.   Skin: Skin is warm and dry. No rash noted. No erythema. No pallor.   Psychiatric: He has a normal mood and affect. His behavior is normal. Judgment and thought content normal.       Assessment:      Encounter Diagnoses   Name Primary?   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 Need for influenza vaccination    . Other male erectile dysfunction    . Benign prostatic hyperplasia with urinary obstruction    . Carpal tunnel syndrome of left wrist    . Skin infection    . Prostate cancer screening    . Colon cancer screening            Plan:      Zaviyar was seen today for diabetes.    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)    Need for influenza vaccination  -     INFLUENZA, QUADV, 3 YRS AND OLDER, IM PF, PREFILL SYR OR SDV, 0.5ML (AFLURIA QUADV, PF)    Other male erectile dysfunction  -     sildenafil (VIAGRA) 100 MG tablet; Take 1 tablet by mouth as needed for Erectile Dysfunction    Benign prostatic hyperplasia with urinary obstruction  -     tamsulosin (FLOMAX) 0.4 MG capsule; Take 1 capsule by mouth daily  -     POCT Urinalysis no Micro    Carpal tunnel syndrome of left wrist  -     EMG; Future    Skin infection  -     doxycycline  hyclate (VIBRAMYCIN) 100 MG capsule; Take 1 capsule by mouth 2 times daily for 10 days    Prostate cancer screening  -     Psa screening    Colon cancer screening  -     [facility] - Nelida Meuse, MD, Gastroenterology, [facility]              Anselm Pancoast, MD

## 2018-01-16 ENCOUNTER — Inpatient Hospital Stay: Admit: 2018-01-16 | Payer: PRIVATE HEALTH INSURANCE | Primary: Internal Medicine

## 2018-01-16 DIAGNOSIS — G5602 Carpal tunnel syndrome, left upper limb: Secondary | ICD-10-CM

## 2018-01-16 NOTE — Procedures (Signed)
Test Date:  01/16/2018    Patient: Jeremiah Guzman DOB: 09-14-1960 Physician: Jim Like DO   Sex: Male ID#:  Ref Phys: Brennan Bailey, MD     Patient Complaints:  Patient is a 58 year-old male who presents with pain numbness and tingling left hand onset last spring.     Patient History / Exam:  PMH:  + diabetes managed with insulin and oral meds. no neck or arm surgery PE: reflexes absent, mild thumb opposition weakness    NCV & EMG Findings:  Evaluation of the left median (APB) motor nerve showed prolonged distal onset latency (8.5 ms) and reduced amplitude (3.4 mV).  The left median sensory nerve showed prolonged distal peak latency (4.0 ms), reduced amplitude (2 ??V), and decreased conduction velocity (35 m/s).  All remaining nerves (as indicated in the following tables) were within normal limits.      All examined muscles (as indicated in the following table) showed no evidence of electrical instability.      Impression: Study is consistent with left carpal tunnel syndrome, moderate severity. No evidence of an acute radiculopathy or other entrapment neuropathy. Thank you.         Jim Like DO        Nerve Conduction Studies  Motor Nerve Results      Latency Amplitude F-Lat Segment Distance CV Comment   Site (ms) Norm (mV) Norm (ms)  (cm) (m/s) Norm    Left Median (APB) Motor   Wrist 8.5  < 4.2 3.4  > 5.0         Elbow 11.9 - - -  Elbow-Wrist 20 59  > 50    Left Ulnar (ADM) Motor   Wrist 4.1  < 4.2 8.3  > 3.0         Bel Elbow 8.1 - 7.4 -  Bel Elbow-Wrist 24 60  > 50    Abv Elbow 8.8 - 6.1 -  Abv Elbow-Bel Elbow 6 86  > 48      Sensory Nerve Results      Latency (Peak) Amplitude (P-P) Segment Distance CV Comment   Site (ms) Norm (??V) Norm  (cm) (m/s) Norm    Left Median Sensory   Wrist-Dig II 4.0  < 3.6 2  > 10 Wrist-Dig II 14 35  > 39    Left Ulnar Sensory   Wrist-Dig V 3.6  < 3.7 16  > 15 Wrist-Dig V 14 39  > 38        Electromyography     Side Muscle Nerve Root Ins Act Fibs Psw Amp Dur Poly Recrt Int  Fraser Din Comment   Left Deltoid Axillary C5-C6 Nml Nml Nml Nml Nml 0 Nml Nml    Left Biceps Musculocut C5-C6 Nml Nml Nml Nml Nml 0 Nml Nml    Left Triceps Radial C6-C8 Nml Nml Nml Nml Nml 0 Nml Nml    Left Brachiorad Radial C5-C6 Nml Nml Nml Nml Nml 0 Nml Nml    Left Pronator Teres Median C6-C7 Nml Nml Nml Nml Nml 0 Nml Nml    Left EIP Post Interosseous,  R... C7-C8 Nml Nml Nml Nml Nml 0 Nml Nml    Left APB Median C8-T1 Nml Nml Nml Nml Nml 0 Nml Nml    Left FDI Ulnar C8-T1 Nml Nml Nml Nml Nml 0 Nml Nml    Left Cervical Paraspinal (Uppe... Rami C1-C3 Nml Nml Nml         Left Cervical Paraspinal (Mid)  Rami C4-C6 Nml Nml Nml         Left Cervical Paraspinal (Lowe... Rami C7-C8 Nml Nml Nml               Electronically signed by Jim Like, DO on 01/16/2018 at 9:22 AM

## 2018-01-16 NOTE — Procedures (Deleted)
Test Date:  01/16/2018    Patient: Jeremiah Guzman DOB: 1960-03-11 Physician: Jim Like DO   Sex: Male ID#:  Ref Phys: Brennan Bailey, MD     Patient Complaints:  Patient is a 58 year-old male who presents with pain numbness and tingling left hand onset last spring.     Patient History / Exam:  PMH:  + diabetes managed with insulin and oral meds. no neck or arm surgery PE:    NCV & EMG Findings:  Evaluation of the left median (APB) motor nerve showed prolonged distal onset latency (8.5 ms) and reduced amplitude (3.4 mV).  The left median sensory nerve showed prolonged distal peak latency (4.0 ms), reduced amplitude (2 ??V), and decreased conduction velocity (35 m/s).  All remaining nerves (as indicated in the following tables) were within normal limits.      All examined muscles (as indicated in the following table) showed no evidence of electrical instability.      Impression: Study is consistent with left carpal tunnel syndrome, moderate severity. No evidence of an acute radiculopathy or other entrapment neuropathy. Thank you.         Jim Like DO        Nerve Conduction Studies  Motor Nerve Results      Latency Amplitude F-Lat Segment Distance CV Comment   Site (ms) Norm (mV) Norm (ms)  (cm) (m/s) Norm    Left Median (APB) Motor   Wrist 8.5  < 4.2 3.4  > 5.0         Elbow 11.9 - - -  Elbow-Wrist 20 59  > 50    Left Ulnar (ADM) Motor   Wrist 4.1  < 4.2 8.3  > 3.0         Bel Elbow 8.1 - 7.4 -  Bel Elbow-Wrist 24 60  > 50    Abv Elbow 8.8 - 6.1 -  Abv Elbow-Bel Elbow 6 86  > 48      Sensory Nerve Results      Latency (Peak) Amplitude (P-P) Segment Distance CV Comment   Site (ms) Norm (??V) Norm  (cm) (m/s) Norm    Left Median Sensory   Wrist-Dig II 4.0  < 3.6 2  > 10 Wrist-Dig II 14 35  > 39    Left Ulnar Sensory   Wrist-Dig V 3.6  < 3.7 16  > 15 Wrist-Dig V 14 39  > 38        Electromyography     Side Muscle Nerve Root Ins Act Fibs Psw Amp Dur Poly Recrt Int Fraser Din Comment   Left Deltoid Axillary C5-C6 Nml  Nml Nml Nml Nml 0 Nml Nml    Left Biceps Musculocut C5-C6 _0  0 Nml Nml    Left Triceps Radial C6-C8 _1  0 Nml Nml    Left Brachiorad Radial C5-C6 _2  0 Nml Nml    Left Pronator Teres Median C6-C7 _3  0 Nml Nml    Left EIP Post Interosseous,  R... C7-C8 _4  0 Nml Nml    Left APB Median C8-T1 _5  0 Nml Nml    Left FDI Ulnar C8-T1 _6  0 Nml Nml    Left Cervical Paraspinal (Uppe... Rami C1-C3 Nml Nml Nml         Left Cervical Paraspinal (Mid) Rami C4-C6 Nml Nml Nml  Left Cervical Paraspinal (Lowe... Rami C7-C8 Nml Nml Nml               Electronically signed by Jim Like, DO on 01/16/2018 at 9:21 AM

## 2018-02-10 NOTE — Progress Notes (Signed)
[facility] PRE-OPERATIVE INSTRUCTIONS    Arrival time____0800________        Surgery time___0900_________    Take the following medications with a sip of water:    Do not eat or drink anything after 12:00 midnight prior to your surgery.EXCEPT PREP  This includes water chewing gum, mints and ice chips.   You may brush your teeth and gargle the morning of your surgery, but do not swallow the water       You may be asked to stop blood thinners such as Coumadin, Plavix, Fragmin, Lovenox, etc., or any anti-inflammatories such as:  Aspirin, Ibuprofen, Advil, Naproxen prior to your surgery.    We also ask that you stop any OTC medications such as fish oil, vitamin E, glucosamine, garlic, Multivitamins, COQ 10, etc.    We ask that you do not smoke 24 hours prior to surgery  We ask that you do not  drink any alcoholic beverages 24 hours prior to surgery     You must make arrangements for a responsible adult to take you home after your surgery.    For your safety you will not be allowed to leave alone or drive yourself home.  Your surgery will be cancelled if you do not have a ride home.     Also for your safety, it is strongly suggested that someone stay with you the first 24 hours after your surgery.     A parent or legal guardian must accompany a child scheduled for surgery and plan to stay at the hospital until the child is discharged.    Please do not bring other children with you.    For your comfort, please wear simple loose fitting clothing to the hospital.  Please do not bring valuables.    Do not wear any make-up or nail polish on your fingers or toes      For your safety, please do not wear any jewelry or body piercing's on the day of surgery.   All jewelry must be removed.      If you have dentures, they will be removed before going to operating room.    For your convenience, we will provide you with a container.    If you wear contact lenses or glasses, they will be removed, please bring a case for  them.     If you have a living will and a durable power of attorney for healthcare, please bring in a copy.     As part of our patient safety program to minimize surgical site infections, we ask you to do the following:    ?? Please notify your surgeon if you develop any illness between         now and the  day of your surgery.    ?? This includes a cough, cold, fever, sore throat, nausea,         or vomiting, and diarrhea, etc.  ??  Please notify your surgeon if you experience dizziness, shortness         of breath or blurred vision between now and the time of your surgery.        You may shower the night before surgery or the morning of   your surgery with an antibacterial soap.    You will need to bring a photo ID and insurance card    [facility] has an onsite pharmacy, would you like to utilize our pharmacy     If you will be   staying overnight and use a C-pap machine, please bring   your C-pap to hospital     Our goal is to provide you with excellent care, therefore, visitors will be limited to two(2) in the room at a time so that we may focus on providing this care for you.          Please contact pre-admission testing if you have any further questions.                 [facility] phone number:  [phone]  Please note these are generalized instructions for all surgical cases, you may be provided with more specific instructions according to your surgery.

## 2018-02-18 ENCOUNTER — Ambulatory Visit: Admit: 2018-02-18 | Primary: Internal Medicine

## 2018-02-18 ENCOUNTER — Inpatient Hospital Stay: Payer: PRIVATE HEALTH INSURANCE

## 2018-02-18 LAB — POCT GLUCOSE: POC Glucose: 148 mg/dl — ABNORMAL HIGH (ref 70–99)

## 2018-02-18 MED ORDER — PROPOFOL 200 MG/20ML IV EMUL
200 | INTRAVENOUS | Status: AC
Start: 2018-02-18 — End: 2018-02-18

## 2018-02-18 MED ORDER — PROPOFOL 200 MG/20ML IV EMUL
200 MG/20ML | INTRAVENOUS | Status: DC | PRN
Start: 2018-02-18 — End: 2018-02-18
  Administered 2018-02-18: 14:00:00 150 via INTRAVENOUS
  Administered 2018-02-18 (×3): 50 via INTRAVENOUS

## 2018-02-18 MED ORDER — SODIUM CHLORIDE 0.9 % IV SOLN
0.9 % | INTRAVENOUS | Status: DC
Start: 2018-02-18 — End: 2018-02-18
  Administered 2018-02-18: 14:00:00 via INTRAVENOUS

## 2018-02-18 MED ORDER — SODIUM CHLORIDE 0.9 % IV SOLN
0.9 % | INTRAVENOUS | Status: DC | PRN
Start: 2018-02-18 — End: 2018-02-18
  Administered 2018-02-18: 14:00:00 via INTRAVENOUS

## 2018-02-18 MED ORDER — NORMAL SALINE FLUSH 0.9 % IV SOLN
0.9 | INTRAVENOUS | Status: DC | PRN
Start: 2018-02-18 — End: 2018-02-18

## 2018-02-18 MED ORDER — LIDOCAINE HCL (PF) 2 % IJ SOLN
2 | INTRAMUSCULAR | Status: AC
Start: 2018-02-18 — End: 2018-02-18

## 2018-02-18 MED ORDER — LIDOCAINE HCL (PF) 2 % IJ SOLN
2 % | INTRAMUSCULAR | Status: DC | PRN
Start: 2018-02-18 — End: 2018-02-18
  Administered 2018-02-18: 14:00:00 50 via INTRAVENOUS

## 2018-02-18 MED ORDER — NORMAL SALINE FLUSH 0.9 % IV SOLN
0.9 % | Freq: Two times a day (BID) | INTRAVENOUS | Status: DC
Start: 2018-02-18 — End: 2018-02-18

## 2018-02-18 MED FILL — XYLOCAINE-MPF 2 % IJ SOLN: 2 % | INTRAMUSCULAR | Qty: 5

## 2018-02-18 MED FILL — DIPRIVAN 200 MG/20ML IV EMUL: 200 MG/20ML | INTRAVENOUS | Qty: 20

## 2018-02-18 NOTE — Progress Notes (Signed)
Dr. Paulina Fusi here to speak with pt. And his Daughter, discharge instructions discussed. Understanding verbalized. Patient and responsible adult verbalized understanding of discharge instructions, sedation medication, and potential complications including pain. Patient instructed to call Doctor if complications occur.

## 2018-02-18 NOTE — H&P (Signed)
[facility]   Pre-operative History and Physical    Patient: Jeremiah Guzman  DOB: [birth year]  Acct#: [id number]    History Obtained From: electronic medical record    HISTORY OF PRESENT ILLNESS  Procedure:Colonoscopy  Indications:screening  Past Medical History:        Diagnosis Date   ??? Basal cell carcinoma 08/2013    left nose   ??? Cancer of skin    ??? Diabetes type 1, uncontrolled ([place])    ??? Erectile dysfunction    ??? HBP (high blood pressure) 03/27/2008   ??? Hyperlipidemia 03/27/2008   ??? Obesity 03/27/2008     Past Surgical History:        Procedure Laterality Date   ??? COLONOSCOPY  01-06-2007    neg Dr. Beatrix Shipper   ??? SKIN BIOPSY     ??? SKIN CANCER EXCISION Left 08/2013    nose   ??? TONSILLECTOMY       Medications prior to admission:   Prior to Admission medications    Medication Sig Start Date End Date Taking? Authorizing Provider   simvastatin (ZOCOR) 40 MG tablet TAKE 1 TABLET BY MOUTH DAILY 08/12/15  Yes Historical Provider, MD   tamsulosin (FLOMAX) 0.4 MG capsule Take 1 capsule by mouth daily 11/01/17  Yes Brennan Bailey, MD   sildenafil (VIAGRA) 100 MG tablet Take 1 tablet by mouth as needed for Erectile Dysfunction 11/01/17  Yes Brennan Bailey, MD   naproxen (NAPROSYN) 500 MG tablet Take 1 tablet by mouth 2 times daily (with meals) 07/22/17  Yes Brennan Bailey, MD   sitaGLIPtan-metformin (JANUMET) 50-500 MG per tablet Take 1 tablet by mouth 2 times daily (with meals) 05/02/17  Yes Brennan Bailey, MD   insulin glargine (LANTUS SOLOSTAR) 100 UNIT/ML injection pen Inject 40 Units into the skin 2 times daily  Patient taking differently: Inject 60 Units into the skin nightly  05/02/17  Yes Brennan Bailey, MD   lisinopril (PRINIVIL;ZESTRIL) 10 MG tablet Take 1 tablet by mouth daily 05/02/17  Yes Brennan Bailey, MD   simvastatin (ZOCOR) 40 MG tablet Take 1 tablet by mouth nightly 05/02/17  Yes Brennan Bailey, MD   Multiple Vitamin TABS Take  by mouth daily.   Yes Historical Provider, MD   aspirin 81 MG tablet Take 81 mg by mouth daily.   Yes  Historical Provider, MD   blood glucose test strips (FREESTYLE TEST STRIPS) strip 1 each by In Vitro route 3 times daily As needed. 08/23/17   Brennan Bailey, MD   insulin lispro (HUMALOG KWIKPEN) 100 UNIT/ML pen Inject 10 Units into the skin 3 times daily (before meals) 08/09/17   Brennan Bailey, MD   glucose monitoring kit (FREESTYLE) monitoring kit 1 kit by Does not apply route three times daily 05/18/16   Brennan Bailey, MD   Lancets MISC Test TID with freestyle meter 05/18/16   Brennan Bailey, MD   Insulin Pen Needle (B-D ULTRAFINE III SHORT PEN) 31G X 8 MM MISC Patient test BID  Dx 250.00 06/17/15   Brennan Bailey, MD   Insulin Syringe-Needle U-100 31G X 5/16" 1 ML MISC 1 each by Does not apply route daily 12/13/14   Brennan Bailey, MD     Allergies:   Patient has no known allergies.    Social History     Socioeconomic History   ??? Marital status: Married     Spouse name: Not on file   ??? Number of children: Not on file   ??? Years  of education: Not on file   ??? Highest education level: Not on file   Occupational History   ??? Not on file   Social Needs   ??? Financial resource strain: Not on file   ??? Food insecurity:     Worry: Not on file     Inability: Not on file   ??? Transportation needs:     Medical: Not on file     Non-medical: Not on file   Tobacco Use   ??? Smoking status: Never Smoker   ??? Smokeless tobacco: Former Chief Strategy Officer and Sexual Activity   ??? Alcohol use: Yes     Comment: weekends   ??? Drug use: No   ??? Sexual activity: Yes     Partners: Female   Lifestyle   ??? Physical activity:     Days per week: Not on file     Minutes per session: Not on file   ??? Stress: Not on file   Relationships   ??? Social connections:     Talks on phone: Not on file     Gets together: Not on file     Attends religious service: Not on file     Active member of club or organization: Not on file     Attends meetings of clubs or organizations: Not on file     Relationship status: Not on file   ??? Intimate partner violence:     Fear of current or ex partner:  Not on file     Emotionally abused: Not on file     Physically abused: Not on file     Forced sexual activity: Not on file   Other Topics Concern   ??? Not on file   Social History Narrative   ??? Not on file     Family History   Problem Relation Age of Onset   ??? Cancer Mother         colon   ??? Cancer Father         skin   ??? Asthma Sister    ??? Diabetes Paternal Grandmother    ??? Heart Attack Maternal Grandfather 40         PHYSICAL EXAM:      BP (!) 182/95    Pulse 88    Temp 97.5 ??F (36.4 ??C) (Temporal)    Resp 16    Ht 6' (1.829 m)    Wt 246 lb (111.6 kg)    BMI 33.36 kg/m??  I        Heart:normal    Lungs: normal    Abdomen: normal      ASA Grade:  See anesthesia note      ASSESSMENT AND PLAN:    1.  Procedure options, risks and benefits reviewed with patient and expresses understanding.

## 2018-02-18 NOTE — Anesthesia Pre-Procedure Evaluation (Signed)
[facility] Department of Anesthesiology  Pre-Anesthesia Evaluation/Consultation       Name:  Jeremiah Guzman  DOB: [birth year]  Age:  58 y.o.                                           MRN:  [id number]  Date: 02/18/2018           Surgeon: Surgeon(s):  Orvan Seen, MD    Procedure: Procedure(s):  COLORECTAL CANCER SCREENING, NOT HIGH RISK     No Known Allergies  Patient Active Problem List   Diagnosis   ??? Obesity   ??? Hyperlipidemia   ??? HTN (hypertension)   ??? Cancer of skin   ??? Jaw swelling   ??? Jaw pain   ??? Type 1 diabetes mellitus, uncontrolled ([place])   ??? Cervical lymphadenopathy   ??? Diabetes type 1, uncontrolled ([facility])     Past Medical History:   Diagnosis Date   ??? Basal cell carcinoma 08/2013    left nose   ??? Cancer of skin    ??? Diabetes type 1, uncontrolled ([place])    ??? Erectile dysfunction    ??? HBP (high blood pressure) 03/27/2008   ??? Hyperlipidemia 03/27/2008   ??? Obesity 03/27/2008     Past Surgical History:   Procedure Laterality Date   ??? COLONOSCOPY  01-06-2007    neg Dr. Beatrix Shipper   ??? SKIN BIOPSY     ??? SKIN CANCER EXCISION Left 08/2013    nose   ??? TONSILLECTOMY       Social History     Tobacco Use   ??? Smoking status: Never Smoker   ??? Smokeless tobacco: Former Chief Strategy [employer] Use Topics   ??? Alcohol use: Yes     Comment: weekends   ??? Drug use: No     Medications  No current facility-administered medications on file prior to encounter.      Current Outpatient Medications on File Prior to Encounter   Medication Sig Dispense Refill   ??? simvastatin (ZOCOR) 40 MG tablet TAKE 1 TABLET BY MOUTH DAILY     ??? tamsulosin (FLOMAX) 0.4 MG capsule Take 1 capsule by mouth daily 90 capsule 3   ??? sildenafil (VIAGRA) 100 MG tablet Take 1 tablet by mouth as needed for Erectile Dysfunction 30 tablet 3   ??? naproxen (NAPROSYN) 500 MG tablet Take 1 tablet by mouth 2 times daily (with meals) 180 tablet 1   ??? sitaGLIPtan-metformin (JANUMET) 50-500 MG per tablet Take 1 tablet by mouth 2 times daily (with meals) 180 tablet 3   ??? insulin glargine  (LANTUS SOLOSTAR) 100 UNIT/ML injection pen Inject 40 Units into the skin 2 times daily (Patient taking differently: Inject 60 Units into the skin nightly ) 21 pen 3   ??? lisinopril (PRINIVIL;ZESTRIL) 10 MG tablet Take 1 tablet by mouth daily 90 tablet 3   ??? simvastatin (ZOCOR) 40 MG tablet Take 1 tablet by mouth nightly 90 tablet 3   ??? Multiple Vitamin TABS Take  by mouth daily.     ??? aspirin 81 MG tablet Take 81 mg by mouth daily.     ??? blood glucose test strips (FREESTYLE TEST STRIPS) strip 1 each by In Vitro route 3 times daily As needed. 300 each 3   ??? insulin lispro (HUMALOG KWIKPEN) 100 UNIT/ML pen Inject 10 Units into the  skin 3 times daily (before meals) 15 pen 3   ??? glucose monitoring kit (FREESTYLE) monitoring kit 1 kit by Does not apply route three times daily 1 kit 0   ??? Lancets MISC Test TID with freestyle meter 300 each 3   ??? Insulin Pen Needle (B-D ULTRAFINE III SHORT PEN) 31G X 8 MM MISC Patient test BID  Dx 250.00 300 each 3   ??? Insulin Syringe-Needle U-100 31G X 5/16" 1 ML MISC 1 each by Does not apply route daily 100 each 5     Current Facility-Administered Medications   Medication Dose Route Frequency Provider Last Rate Last Dose   ??? 0.9 % sodium chloride infusion   Intravenous Continuous Stann Mainland, MD       ??? sodium chloride flush 0.9 % injection 10 mL  10 mL Intravenous 2 times per day Stann Mainland, MD       ??? sodium chloride flush 0.9 % injection 10 mL  10 mL Intravenous PRN Stann Mainland, MD         Vital Signs (Current)   Vitals:    02/10/18 1324 02/18/18 0832   BP:  (!) 182/95   Pulse:  88   Resp:  16   Temp:  97.5 ??F (36.4 ??C)   TempSrc:  Temporal   Weight: 245 lb (111.1 kg) 246 lb (111.6 kg)   Height: 6' (1.829 m) 6' (1.829 m)                                          BP Readings from Last 3 Encounters:   02/18/18 (!) 182/95   11/01/17 126/78   05/02/17 128/82     Vital Signs Statistics (for past 48 hrs)     Temp  Avg: 97.5 ??F (36.4 ??C)  Min: 97.5 ??F (36.4 ??C)   Min  taken time: 02/18/18 0832  Max: 97.5 ??F (36.4 ??C)   Max taken time: 02/18/18 0832  Pulse  Avg: 88  Min: 88   Min taken time: 02/18/18 0832  Max: 88   Max taken time: 02/18/18 0832  Resp  Avg: 16  Min: 29   Min taken time: 02/18/18 0832  Max: 16   Max taken time: 02/18/18 0832  BP  Min: 182/95   Min taken time: 02/18/18 0832  Max: 182/95   Max taken time: 02/18/18 0832  BP Readings from Last 3 Encounters:   02/18/18 (!) 182/95   11/01/17 126/78   05/02/17 128/82       BMI  Body mass index is 33.36 kg/m??.  Estimated body mass index is 33.36 kg/m?? as calculated from the following:    Height as of this encounter: 6' (1.829 m).    Weight as of this encounter: 246 lb (111.6 kg).    CBC   Lab Results   Component Value Date    WBC 7.5 04/29/2017    RBC 4.72 04/29/2017    HGB 15.7 04/29/2017    HCT 46.6 04/29/2017    MCV 98.7 04/29/2017    RDW 11.9 04/29/2017    PLT 193 04/29/2017     CMP    Lab Results   Component Value Date    NA 140 10/29/2017    K 4.4 10/29/2017    CL 100 10/29/2017    CO2 27 10/29/2017    BUN 13 10/29/2017  CREATININE 0.9 10/29/2017    GFRAA >60 10/29/2017    GFRAA >60 04/10/2011    AGRATIO 1.6 10/29/2017    LABGLOM >60 10/29/2017    GLUCOSE 92 10/29/2017    PROT 6.8 10/29/2017    PROT 6.5 06/29/2010    CALCIUM 9.3 10/29/2017    BILITOT 0.5 10/29/2017    ALKPHOS 49 10/29/2017    AST 33 10/29/2017    ALT 38 10/29/2017     BMP    Lab Results   Component Value Date    NA 140 10/29/2017    K 4.4 10/29/2017    CL 100 10/29/2017    CO2 27 10/29/2017    BUN 13 10/29/2017    CREATININE 0.9 10/29/2017    CALCIUM 9.3 10/29/2017    GFRAA >60 10/29/2017    GFRAA >60 04/10/2011    LABGLOM >60 10/29/2017    GLUCOSE 92 10/29/2017     POCGlucose  No results for input(s): GLUCOSE in the last 72 hours.   Coags  No results found for: PROTIME, INR, APTT  HCG (If Applicable) No results found for: PREGTESTUR, PREGSERUM, HCG, HCGQUANT   ABGs No results found for: PHART, PO2ART, PCO2ART, HCO3ART, BEART, O2SATART   Type &  Screen (If Applicable)  No results found for: LABABO, LABRH                         BMI: Wt Readings from Last 3 Encounters:       NPO Status:                          Anesthesia Evaluation  Patient summary reviewed  Airway: Mallampati: III  TM distance: >3 FB   Neck ROM: full  Mouth opening: > = 3 FB Dental: normal exam         Pulmonary: breath sounds clear to auscultation      (-) COPD, asthma and shortness of breath                           Cardiovascular:    (+) hypertension:, hyperlipidemia    (-) valvular problems/murmurs, past MI, CAD, CABG/stent, dysrhythmias and  angina      Rhythm: regular  Rate: normal                    Neuro/Psych:      (-) seizures, TIA and CVA           GI/Hepatic/Renal:        (-) GERD, PUD, liver disease and no renal disease       Endo/Other:    (+) DiabetesType II DM, , malignancy/cancer.                 Abdominal:           Vascular: negative vascular ROS.                                       Anesthesia Plan      MAC     ASA 3     (I discussed local anesthesia with intravenous sedation to the   patient's satisfaction and agreed including risks and alternatives. The  patient has no further questions.    Tylee Newby JOSEPH Debie Ashline )  Induction: intravenous.  Anesthetic plan and risks discussed with patient.      Plan discussed with CRNA.                  This pre-anesthesia assessment may be used as a history and physical.    DOS STAFF ADDENDUM:    Pt seen and examined, chart reviewed (including anesthesia, drug and allergy history).  No interval changes to history and physical examination.  Anesthetic plan, risks, benefits, alternatives, and personnel involved discussed with patient.  Patient verbalized an understanding and agrees to proceed.      Antonieta Pert, MD  February 18, 2018  8:38 AM

## 2018-02-18 NOTE — Procedures (Signed)
[facility]  Endoscopy Note    Patient: Jeremiah Guzman  DOB: 1960/08/24  Acct#: [id number]    Procedure: Colonoscopy     Date:  02/18/2018    Surgeon:  Rudell Cobb, MD    Referring Physician:  Selena Batten    Previous Colonoscopy: Yes  Date: unknown  Greater than 3 years? Yes    Preoperative Diagnosis:  screening    Postoperative Diagnosis:  Normal colon    Anesthesia:  See anesthesia note    Indications: This is a 58 y.o. year old male who presents today with screening for colon cancer.    Procedure:   An informed consent was obtained from the patient after explanation of indications, benefits, possible risks and complications of the procedure.  The patient was then taken to the endoscopy suite, placed in the left lateral decubitus position, and the above IV anesthesia was administered.    A digital rectal examination was performed and revealed negative without mass, lesions or tenderness.      The Olympus CFQ-180-AL video colonoscope was placed in the patient's rectum under digital direction and advanced to the cecum. The cecum was identified by characteristic anatomy and ballottment.  The ileocecal valve was identified.    The preparation was good.    The scope was then withdrawn back through the cecum, ascending, transverse, descending and sigmoid colons.  Carefull circumferential examination of the mucosa in these areas demonstrated normal colonic mucosa throughout.   The scope was then withdrawn into the rectum and retroflexed.  The retroflexed view of the anal verge and rectum demonstrates no abnormalities.  The scope was straightened, the colon was decompressed and the scope was withdrawn from the patient.  The patient tolerated the procedure well and was taken to the PACU in good condition.    Estimated Blood Loss:  none    Impression:  Normal colon    Recommendations:  Repeat colonoscopy in 10 years.    Rudell Cobb, MD   [facility]  02/18/2018

## 2018-02-18 NOTE — Anesthesia Post-Procedure Evaluation (Signed)
Department of Anesthesiology  Postprocedure Note    Patient: Jeremiah Guzman  MRN: [id number]  Birthdate: 05/09/1960  Date of evaluation: 02/18/2018  Time:  11:38 AM     Procedure Summary     Date:  02/18/18 Room / Location:  [facility]    Anesthesia Start:  [phone] Anesthesia Stop:  0905    Procedure:  COLORECTAL CANCER SCREENING, NOT HIGH RISK (N/A ) Diagnosis:       Screen for colon cancer      (SCREENING)    Surgeon:  Orvan Seen, MD Responsible Provider:  Antonieta Pert, MD    Anesthesia Type:  MAC ASA Status:  3          Anesthesia Type: MAC    Aldrete Phase I: Aldrete Score: 10    Aldrete Phase II: Aldrete Score: 10    Last vitals: Reviewed and per EMR flowsheets.       Anesthesia Post Evaluation    Patient location during evaluation: PACU  Level of consciousness: awake and alert  Airway patency: patent  Nausea & Vomiting: no nausea and no vomiting  Complications: no  Cardiovascular status: blood pressure returned to baseline  Respiratory status: acceptable  Hydration status: euvolemic  Comments: Postoperative Anesthesia Note    Name:    JAYVIAN ESCOE  MRN:      [id number]    Patient Vitals in the past 12 hrs:  02/18/18 0923, BP:(!) 150/89, Pulse:75, Resp:16, SpO2:100 %  02/18/18 0916, BP:(!) 143/86, Pulse:83, Resp:14, SpO2:98 %  02/18/18 0905, BP:(!) 154/76, Temp:98.7 ??F (37.1 ??C), Temp [id number], Pulse:84, Resp:16, SpO2:100 %  02/18/18 0832, BP:(!) 182/95, Temp:97.5 ??F (36.4 ??C), Temp [id number], Pulse:88, Resp:16, Height:6' (1.829 m), Weight:246 lb (111.6 kg)     LABS:    CBC  Lab Results       Component                Value               Date/Time                  WBC                      7.5                 04/29/2017 09:09 AM        HGB                      15.7                04/29/2017 09:09 AM        HCT                      46.6                04/29/2017 09:09 AM        PLT                      193                 04/29/2017 09:09 AM   RENAL  Lab Results        Component                Value  Date/Time                  NA                       140                 10/29/2017 09:13 AM        K                        4.4                 10/29/2017 09:13 AM        CL                       100                 10/29/2017 09:13 AM        CO2                      27                  10/29/2017 09:13 AM        BUN                      13                  10/29/2017 09:13 AM        CREATININE               0.9                 10/29/2017 09:13 AM        GLUCOSE                  92                  10/29/2017 09:13 AM   COAGS  No results found for: PROTIME, INR, APTT    Intake & Output:  _0 @    Nausea & Vomiting:  No    Level of Consciousness:  Awake    Pain Assessment:  Adequate analgesia    Anesthesia Complications:  No apparent anesthetic complications    SUMMARY      Vital signs stable  OK to discharge from Stage I post anesthesia care.  Care transferred from Anesthesiology department on discharge from perioperative area

## 2018-02-18 NOTE — Discharge Instructions (Signed)
[facility] Discharge Instructions  Colonoscopy    NAME:  Jeremiah Guzman  DATE OF BIRTH:  [birth year]  MEDICAL RECORD NUMBER:  [id number]  DATE:  02/18/2018      . After receiving Propofol (Diprivan) for Moderate Sedation:    o Do not drive or operate any machinery until tomorrow  o Do not sign any legal documents or make any critical decisions  o Do not drink alcoholic beverages for 24 hours  o Plan to spend a few hours resting before resuming your normal routine  o Possible side effects are light headedness and sedation    . You may resume your usual diet at home    . Resume all your daily medications    . Call your physician if any of the following occur:    o Severe abdominal distention and/or pain.  (Mild distention or cramping is normal after this procedure; this should improve within an hour or two with passage of air)  o Fever, chills, nausea or vomiting  o You may notice a small amount of blood in your next few bowel movements    . If excessive bleeding occurs:  Call your physician immediately or proceed to the nearest Emergency Room    . Biopsy Obtained: NO      Recommendations: Repeat colonoscopy in 10 years         For questions or concerns please contact your GI physician's 24 hour call center at [phone].

## 2018-05-02 ENCOUNTER — Telehealth: Payer: PRIVATE HEALTH INSURANCE | Attending: Internal Medicine

## 2018-05-02 ENCOUNTER — Encounter
Admit: 2018-05-02 | Discharge: 2018-05-02 | Payer: PRIVATE HEALTH INSURANCE | Attending: Internal Medicine | Primary: Internal Medicine

## 2018-05-02 DIAGNOSIS — J0100 Acute maxillary sinusitis, unspecified: Secondary | ICD-10-CM

## 2018-05-02 DIAGNOSIS — 1 ERRONEOUS ENCOUNTER ICD10: Secondary | ICD-10-CM

## 2018-05-02 MED ORDER — AMOXICILLIN-POT CLAVULANATE 875-125 MG PO TABS
875-125 MG | ORAL_TABLET | Freq: Two times a day (BID) | ORAL | 0 refills | Status: AC
Start: 2018-05-02 — End: 2018-05-12

## 2018-05-02 NOTE — Progress Notes (Signed)
Subjective:      Patient ID: Jeremiah Guzman is a 58 y.o. male.    Patient presents with:  Sinusitis: phone visit, [phone], sinus drainage, had toothache in Feb and got pcn from dentist for sinus, now having sinus pain and pressure in his cheeks. 5 days of Amoxiciline was not good enough.  He had no dental problems at that time. Was sinus pain.    Jeremiah Guzman is a 58 y.o. male with the following history as recorded in Epic[employer]:  Patient Active Problem List    Diabetes type 1, uncontrolled ([place])      Cervical lymphadenopathy         Date Noted: 12/18/2011      Type 1 diabetes mellitus, uncontrolled ([place])         Date Noted: 04/10/2011      Jaw swelling         Date Noted: 02/25/2010      Jaw pain         Date Noted: 02/25/2010      Cancer of skin      Obesity         Date Noted: 03/27/2008      Hyperlipidemia         Date Noted: 03/27/2008      HTN (hypertension)         Date Noted: 03/27/2008      Current Outpatient Medications:  amoxicillin-clavulanate (AUGMENTIN) 875-125 MG per tablet, Take 1 tablet by mouth 2 times daily (with meals) for 10 days, Disp: 20 tablet, Rfl: 0  simvastatin (ZOCOR) 40 MG tablet, TAKE 1 TABLET BY MOUTH DAILY, Disp: , Rfl:   tamsulosin (FLOMAX) 0.4 MG capsule, Take 1 capsule by mouth daily, Disp: 90 capsule, Rfl: 3  sildenafil (VIAGRA) 100 MG tablet, Take 1 tablet by mouth as needed for Erectile Dysfunction, Disp: 30 tablet, Rfl: 3  blood glucose test strips (FREESTYLE TEST STRIPS) strip, 1 each by In Vitro route 3 times daily As needed., Disp: 300 each, Rfl: 3  insulin lispro (HUMALOG KWIKPEN) 100 UNIT/ML pen, Inject 10 Units into the skin 3 times daily (before meals), Disp: 15 pen, Rfl: 3  naproxen (NAPROSYN) 500 MG tablet, Take 1 tablet by mouth 2 times daily (with meals), Disp: 180 tablet, Rfl: 1  sitaGLIPtan-metformin (JANUMET) 50-500 MG per tablet, Take 1 tablet by mouth 2 times daily (with meals), Disp: 180 tablet, Rfl: 3  insulin glargine (LANTUS SOLOSTAR) 100 UNIT/ML  injection pen, Inject 40 Units into the skin 2 times daily (Patient taking differently: Inject 60 Units into the skin nightly ), Disp: 21 pen, Rfl: 3  lisinopril (PRINIVIL;ZESTRIL) 10 MG tablet, Take 1 tablet by mouth daily, Disp: 90 tablet, Rfl: 3  simvastatin (ZOCOR) 40 MG tablet, Take 1 tablet by mouth nightly, Disp: 90 tablet, Rfl: 3  glucose monitoring kit (FREESTYLE) monitoring kit, 1 kit by Does not apply route three times daily, Disp: 1 kit, Rfl: 0  Lancets MISC, Test TID with freestyle meter, Disp: 300 each, Rfl: 3  Insulin Pen Needle (B-D ULTRAFINE III SHORT PEN) 31G X 8 MM MISC, Patient test BIDDx 250.00, Disp: 300 each, Rfl: 3  Insulin Syringe-Needle U-100 31G X 5/16" 1 ML MISC, 1 each by Does not apply route daily, Disp: 100 each, Rfl: 5  Multiple Vitamin TABS, Take  by mouth daily., Disp: , Rfl:   aspirin 81 MG tablet, Take 81 mg by mouth daily., Disp: , Rfl:     No current facility-administered  medications for this visit.     Allergies: Patient has no known allergies.  Past Medical History:  08/2013: Basal cell carcinoma      Comment:  left nose  No date: Cancer of skin  No date: Diabetes type 1, uncontrolled ([place])  No date: Erectile dysfunction  03/27/2008: HBP (high blood pressure)  03/27/2008: Hyperlipidemia  03/27/2008: Obesity  Past Surgical History:  01-06-2007: COLONOSCOPY      Comment:  neg Dr. Beatrix Shipper  02/18/2018: COLONOSCOPY; N/A      Comment:  COLORECTAL CANCER SCREENING, NOT HIGH RISK performed by                Orvan Seen, MD at [facility]  No date: SKIN BIOPSY  08/2013: SKIN CANCER EXCISION; Left      Comment:  nose  No date: TONSILLECTOMY  Review of patient's family history indicates:  Problem: Cancer      Relation: Mother          Age of Onset: (Not Specified)          Comment: colon  Problem: Cancer      Relation: Father          Age of Onset: (Not Specified)          Comment: skin  Problem: Asthma      Relation: Sister          Age of Onset: (Not Specified)  Problem: Diabetes       Relation: Paternal Grandmother          Age of Onset: (Not Specified)  Problem: Heart Attack      Relation: Maternal Grandfather          Age of Onset: 68    Social History    Tobacco Use      Smoking status: Never Smoker      Smokeless tobacco: Former Systems developer    Alcohol use: Yes      Comment: weekends  Jeremiah Guzman is a 58 y.o. male evaluated via telephone on 05/02/2018.      Consent:  He and/or health care decision maker is aware that that he may receive a bill for this telephone service, depending on his insurance coverage, and has provided verbal consent to proceed: Yes      Documentation:  I communicated with the patient and/or health care decision maker about sinus pain..   Details of this discussion including any medical advice provided: by me.      I affirm this is a Patient Initiated Episode with an Established Patient who has not had a related appointment within my department in the past 7 days or scheduled within the next 24 hours.    Patient identification was verified at the start of the visit: Yes    Total Time: minutes: 21-30 minutes    Note: not billable if this call serves to triage the patient into an appointment for the relevant concern      Jeremiah Guzman       Sinusitis   This is a recurrent problem. The current episode started in the past 7 days. The problem is unchanged. There has been no fever. The pain is moderate. Associated symptoms include sinus pressure. Pertinent negatives include no congestion, ear pain or headaches. Past treatments include nothing. The treatment provided no relief.       Review of Systems   Constitutional: Negative for fatigue.   HENT: Positive for facial swelling and sinus pressure. Negative for  congestion, ear pain and hearing loss.    Respiratory: Negative.  Negative for apnea.    Cardiovascular: Negative.    Gastrointestinal: Negative.    Musculoskeletal: Negative.    Skin: Positive for color change.        Lt side face,red and puffy. Tender.   Neurological: Negative  for headaches.   Psychiatric/Behavioral: Negative for dysphoric mood.       Objective:   Physical Exam    Assessment:      Encounter Diagnoses   Name Primary?   ??? Acute maxillary sinusitis, recurrence not specified Yes   ??? Uncontrolled type 1 diabetes mellitus with hyperglycemia ([place])    ??? Pure hypercholesterolemia    ??? Essential hypertension            Plan:      Afshin was seen today for sinusitis.    Diagnoses and all orders for this visit:    Acute maxillary sinusitis, recurrence not specified    Uncontrolled type 1 diabetes mellitus with hyperglycemia ([place])    Pure hypercholesterolemia    Essential hypertension    Other orders  -     amoxicillin-clavulanate (AUGMENTIN) 875-125 MG per tablet; Take 1 tablet by mouth 2 times daily (with meals) for 10 days      Blood sugar was around 140's. 5 days of Amoxicillin was not enough last time.        Brennan Bailey, MD

## 2018-05-02 NOTE — Progress Notes (Signed)
Phone visit

## 2018-05-21 NOTE — Telephone Encounter (Signed)
Pt calling is still having issues with his sinus's can something stronger called in.

## 2018-05-22 MED ORDER — METHYLPREDNISOLONE 4 MG PO TBPK
4 MG | PACK | ORAL | 0 refills | Status: AC
Start: 2018-05-22 — End: 2018-05-28

## 2018-05-22 MED ORDER — AMOXICILLIN-POT CLAVULANATE 875-125 MG PO TABS
875-125 MG | ORAL_TABLET | Freq: Two times a day (BID) | ORAL | 0 refills | Status: AC
Start: 2018-05-22 — End: 2018-06-01

## 2018-05-22 NOTE — Telephone Encounter (Signed)
Ordered Augumentine and Medrol dose pack. Watch blood sugar with Medrol dose pack.

## 2018-05-22 NOTE — Telephone Encounter (Signed)
rx's sent to [facility]. Pt informed and advised to keep an eye on his sugars.

## 2018-05-26 ENCOUNTER — Encounter

## 2018-05-26 MED ORDER — LISINOPRIL 10 MG PO TABS
10 MG | ORAL_TABLET | ORAL | 3 refills | Status: DC
Start: 2018-05-26 — End: 2018-11-07

## 2018-05-26 MED ORDER — SIMVASTATIN 40 MG PO TABS
40 MG | ORAL_TABLET | ORAL | 3 refills | Status: DC
Start: 2018-05-26 — End: 2018-11-07

## 2018-05-26 MED ORDER — JANUMET 50-500 MG PO TABS
50-500 MG | ORAL_TABLET | ORAL | 3 refills | Status: DC
Start: 2018-05-26 — End: 2018-11-07

## 2018-06-27 ENCOUNTER — Encounter

## 2018-06-27 MED ORDER — LANTUS SOLOSTAR 100 UNIT/ML SC SOPN
100 UNIT/ML | SUBCUTANEOUS | 3 refills | Status: DC
Start: 2018-06-27 — End: 2018-11-07

## 2018-08-04 MED ORDER — FREESTYLE LITE TEST VI STRP
3 refills | Status: AC
Start: 2018-08-04 — End: ?

## 2018-09-25 ENCOUNTER — Encounter

## 2018-09-25 MED ORDER — NAPROXEN 500 MG PO TABS
500 MG | ORAL_TABLET | ORAL | 3 refills | Status: DC
Start: 2018-09-25 — End: 2018-11-07

## 2018-10-09 ENCOUNTER — Encounter

## 2018-10-09 MED ORDER — TAMSULOSIN HCL 0.4 MG PO CAPS
0.4 MG | ORAL_CAPSULE | ORAL | 3 refills | Status: DC
Start: 2018-10-09 — End: 2018-11-07

## 2018-10-24 ENCOUNTER — Telehealth

## 2018-10-24 NOTE — Telephone Encounter (Signed)
Pt has an appt on Oct. 30, 2002 for a DM check up pt is needing blood work prior to appt.  Pl advise. [phone]

## 2018-10-27 NOTE — Telephone Encounter (Signed)
Orders done, pt informed.

## 2018-11-03 ENCOUNTER — Encounter

## 2018-11-04 LAB — COMPREHENSIVE METABOLIC PANEL
ALT: 43 U/L — ABNORMAL HIGH (ref 10–40)
AST: 45 U/L — ABNORMAL HIGH (ref 15–37)
Albumin/Globulin Ratio: 1.5 (ref 1.1–2.2)
Albumin: 4.1 g/dL (ref 3.4–5.0)
Alkaline Phosphatase: 67 U/L (ref 40–129)
Anion Gap: 12 (ref 3–16)
BUN: 12 mg/dL (ref 7–20)
CO2: 24 mmol/L (ref 21–32)
Calcium: 8.9 mg/dL (ref 8.3–10.6)
Chloride: 104 mmol/L (ref 99–110)
Creatinine: 0.9 mg/dL (ref 0.9–1.3)
GFR African American: >60 (ref >60)
GFR Non-African American: >60 (ref >60)
Globulin: 2.8 g/dL
Glucose: 92 mg/dL (ref 70–99)
Potassium: 4.7 mmol/L (ref 3.5–5.1)
Sodium: 140 mmol/L (ref 136–145)
Total Bilirubin: 0.7 mg/dL (ref 0.0–1.0)
Total Protein: 6.9 g/dL (ref 6.4–8.2)

## 2018-11-04 LAB — CBC
Hematocrit: 51.6 % (ref 40.5–52.5)
Hemoglobin: 16.9 g/dL (ref 13.5–17.5)
MCH: 31.8 pg (ref 26.0–34.0)
MCHC: 32.7 g/dL (ref 31.0–36.0)
MCV: 97.1 fL (ref 80.0–100.0)
MPV: 9.9 fL (ref 5.0–10.5)
Platelets: 217 10*3/uL (ref 135–450)
RBC: 5.31 M/uL (ref 4.20–5.90)
RDW: 12.8 % (ref 12.4–15.4)
WBC: 7.3 10*3/uL (ref 4.0–11.0)

## 2018-11-04 LAB — LIPID PANEL
Cholesterol, Total: 149 mg/dL (ref 0–199)
HDL: 54 mg/dL (ref 40–60)
LDL Calculated: 83 mg/dL (ref <100)
Triglycerides: 62 mg/dL (ref 0–150)
VLDL Cholesterol Calculated: 12 mg/dL

## 2018-11-04 LAB — HEMOGLOBIN A1C
Hemoglobin A1C: 7.9 %
eAG: 180 mg/dL

## 2018-11-04 LAB — PSA SCREENING: PSA: 0.97 ng/mL (ref 0.00–4.00)

## 2018-11-07 ENCOUNTER — Ambulatory Visit
Admit: 2018-11-07 | Discharge: 2018-11-07 | Payer: BLUE CROSS/BLUE SHIELD | Attending: Internal Medicine | Primary: Internal Medicine

## 2018-11-07 DIAGNOSIS — M7712 Lateral epicondylitis, left elbow: Secondary | ICD-10-CM

## 2018-11-07 MED ORDER — LANTUS SOLOSTAR 100 UNIT/ML SC SOPN
100 UNIT/ML | Freq: Every evening | SUBCUTANEOUS | 3 refills | Status: DC
Start: 2018-11-07 — End: 2019-02-13

## 2018-11-07 MED ORDER — SIMVASTATIN 40 MG PO TABS
40 MG | ORAL_TABLET | Freq: Every evening | ORAL | 3 refills | Status: DC
Start: 2018-11-07 — End: 2019-02-13

## 2018-11-07 MED ORDER — TAMSULOSIN HCL 0.4 MG PO CAPS
0.4 MG | ORAL_CAPSULE | Freq: Every day | ORAL | 3 refills | Status: DC
Start: 2018-11-07 — End: 2019-02-13

## 2018-11-07 MED ORDER — JANUMET 50-500 MG PO TABS
50-500 MG | ORAL_TABLET | Freq: Two times a day (BID) | ORAL | 3 refills | Status: DC
Start: 2018-11-07 — End: 2019-02-13

## 2018-11-07 MED ORDER — SILDENAFIL CITRATE 100 MG PO TABS
100 | ORAL_TABLET | ORAL | 3 refills | Status: AC | PRN
Start: 2018-11-07 — End: ?

## 2018-11-07 MED ORDER — LISINOPRIL 10 MG PO TABS
10 MG | ORAL_TABLET | Freq: Every day | ORAL | 3 refills | Status: DC
Start: 2018-11-07 — End: 2019-02-13

## 2018-11-07 MED ORDER — METHYLPREDNISOLONE ACETATE 80 MG/ML IJ SUSP
80 MG/ML | Freq: Once | INTRAMUSCULAR | Status: AC
Start: 2018-11-07 — End: 2018-11-07
  Administered 2018-11-07: 20:00:00 80 mg via INTRALESIONAL

## 2018-11-07 MED ORDER — NAPROXEN 500 MG PO TABS
500 MG | ORAL_TABLET | Freq: Two times a day (BID) | ORAL | 3 refills | Status: DC
Start: 2018-11-07 — End: 2019-02-13

## 2018-11-07 NOTE — Patient Instructions (Signed)
Please call your pharmacy if you need any refills of your medication(s).    Please call our office at [phone] if you don't hear from us about your test results.                                                                 Bring an accurate list of your medications with you at every appointment to ensure that we have the correct information.    Our office hours are: Monday - Friday 8:30 am- 5 pm    Phone lines turn on at 8:30 am

## 2018-11-07 NOTE — Progress Notes (Signed)
Subjective:      Patient ID: Jeremiah Guzman is a 58 y.o. male.      Patient presents with:  Annual Exam: yearly physical, diabetic check, discuss labs lt elbow pain from tennis elbow.    Jeremiah Guzman is a 58 y.o. male with the following history as recorded in [employer]:  Patient Active Problem List    Diabetes type 1, uncontrolled ([place])      Cervical lymphadenopathy         Date Noted: 12/18/2011      Type 1 diabetes mellitus, uncontrolled ([place])         Date Noted: 04/10/2011      Jaw swelling         Date Noted: 02/25/2010      Jaw pain         Date Noted: 02/25/2010      Cancer of skin      Obesity         Date Noted: 03/27/2008      Hyperlipidemia         Date Noted: 03/27/2008      HTN (hypertension)         Date Noted: 03/27/2008      Current Outpatient Medications:  tamsulosin (FLOMAX) 0.4 MG capsule, Take 1 capsule by mouth daily, Disp: 90 capsule, Rfl: 3  naproxen (NAPROSYN) 500 MG tablet, Take 1 tablet by mouth 2 times daily (with meals), Disp: 180 tablet, Rfl: 3  insulin glargine (LANTUS SOLOSTAR) 100 UNIT/ML injection pen, Inject 40 Units into the skin nightly, Disp: 75 mL, Rfl: 3  sitaGLIPtan-metformin (JANUMET) 50-500 MG per tablet, Take 1 tablet by mouth 2 times daily (with meals), Disp: 180 tablet, Rfl: 3  lisinopril (PRINIVIL;ZESTRIL) 10 MG tablet, Take 1 tablet by mouth daily, Disp: 90 tablet, Rfl: 3  simvastatin (ZOCOR) 40 MG tablet, Take 1 tablet by mouth nightly, Disp: 90 tablet, Rfl: 3  sildenafil (VIAGRA) 100 MG tablet, Take 1 tablet by mouth as needed for Erectile Dysfunction, Disp: 30 tablet, Rfl: 3  FREESTYLE LITE strip, USE THREE TIMES A DAY AS NEEDED, Disp: 300 each, Rfl: 3  insulin lispro (HUMALOG KWIKPEN) 100 UNIT/ML pen, Inject 10 Units into the skin 3 times daily (before meals), Disp: 15 pen, Rfl: 3  glucose monitoring kit (FREESTYLE) monitoring kit, 1 kit by Does not apply route three times daily, Disp: 1 kit, Rfl: 0  Lancets MISC, Test TID with freestyle meter, Disp: 300 each, Rfl:  3  Insulin Pen Needle (B-D ULTRAFINE III SHORT PEN) 31G X 8 MM MISC, Patient test BIDDx 250.00, Disp: 300 each, Rfl: 3  Insulin Syringe-Needle U-100 31G X 5/16" 1 ML MISC, 1 each by Does not apply route daily, Disp: 100 each, Rfl: 5  Multiple Vitamin TABS, Take  by mouth daily., Disp: , Rfl:   aspirin 81 MG tablet, Take 81 mg by mouth daily., Disp: , Rfl:     No current facility-administered medications for this visit.     Allergies: Patient has no known allergies.  Past Medical History:  08/2013: Basal cell carcinoma      Comment:  left nose  No date: Cancer of skin  No date: Diabetes type 1, uncontrolled ([place])  No date: Erectile dysfunction  03/27/2008: HBP (high blood pressure)  03/27/2008: Hyperlipidemia  03/27/2008: Obesity  Past Surgical History:  01-06-2007: COLONOSCOPY      Comment:  neg Dr. Beatrix Shipper  02/18/2018: COLONOSCOPY; N/A      Comment:  COLORECTAL  CANCER SCREENING, NOT HIGH RISK performed by                Orvan Seen, MD at [facility]  No date: SKIN BIOPSY  08/2013: SKIN CANCER EXCISION; Left      Comment:  nose  No date: TONSILLECTOMY  Review of patient's family history indicates:  Problem: Cancer      Relation: Mother          Age of Onset: (Not Specified)          Comment: colon  Problem: Cancer      Relation: Father          Age of Onset: (Not Specified)          Comment: skin  Problem: Asthma      Relation: Sister          Age of Onset: (Not Specified)  Problem: Diabetes      Relation: Paternal Grandmother          Age of Onset: (Not Specified)  Problem: Heart Attack      Relation: Maternal Grandfather          Age of Onset: 30    Social History    Tobacco Use      Smoking status: Never Smoker      Smokeless tobacco: Former Systems developer    Alcohol use: Yes      Comment: weekends      Diabetes   He presents for his follow-up diabetic visit. He has type 2 diabetes mellitus. His disease course has been stable. There are no hypoglycemic associated symptoms. Pertinent negatives for hypoglycemia include  no confusion, dizziness, pallor, seizures or tremors. There are no diabetic associated symptoms. Pertinent negatives for diabetes include no chest pain and no fatigue. There are no hypoglycemic complications. Symptoms are stable. Diabetic complications include impotence. Pertinent negatives for diabetic complications include no CVA. Risk factors for coronary artery disease include diabetes mellitus, obesity, male sex, hypertension and dyslipidemia. Current diabetic treatment includes insulin injections and oral agent (dual therapy). He is compliant with treatment all of the time. His weight is stable. Meal planning includes avoidance of concentrated sweets. His breakfast blood glucose range is generally 130-140 mg/dl. An ACE inhibitor/angiotensin II receptor blocker is being taken. Eye exam is current.   Arm Pain    The incident occurred more than 1 week ago. There was no injury mechanism. The pain is present in the left elbow. The quality of the pain is described as aching. The pain radiates to the left arm. The pain is moderate. The pain has been constant since the incident. Pertinent negatives include no chest pain or numbness.       Review of Systems   Constitutional: Negative.  Negative for fatigue.   HENT: Negative for ear pain, hearing loss and sinus pressure.    Eyes: Negative.  Negative for visual disturbance.   Respiratory: Negative for apnea, choking and stridor.    Cardiovascular: Negative for chest pain.   Gastrointestinal: Negative.    Genitourinary: Positive for impotence. Negative for discharge, hematuria, penile pain and penile swelling.   Musculoskeletal: Positive for arthralgias. Negative for back pain.        Lt arm pain.   Skin: Negative.  Negative for color change and pallor.   Neurological: Negative for dizziness, tremors, seizures, syncope and numbness.   Hematological: Does not bruise/bleed easily.   Psychiatric/Behavioral: Negative for confusion, decreased concentration and dysphoric mood.  Objective:   Physical Exam  Constitutional:       Appearance: He is well-developed.   HENT:      Head: Normocephalic and atraumatic.      Right Ear: External ear normal.      Left Ear: External ear normal.      Nose: Nose normal.   Eyes:      General: No scleral icterus.        Right eye: No discharge.         Left eye: No discharge.      Conjunctiva/sclera: Conjunctivae normal.   Neck:      Musculoskeletal: Normal range of motion and neck supple.      Thyroid: No thyromegaly.      Vascular: No JVD.      Trachea: No tracheal deviation.   Cardiovascular:      Rate and Rhythm: Normal rate and regular rhythm.      Heart sounds: Normal heart sounds. No murmur. No friction rub. No gallop.    Pulmonary:      Effort: Pulmonary effort is normal. No respiratory distress.      Breath sounds: Normal breath sounds. No stridor. No wheezing or rales.   Chest:      Chest wall: No tenderness.   Abdominal:      General: Bowel sounds are normal. There is no distension.      Palpations: Abdomen is soft. There is no mass.      Tenderness: There is no abdominal tenderness. There is no guarding or rebound.   Genitourinary:     Penis: Normal. No tenderness.       Prostate: Normal.      Rectum: Normal. Guaiac result negative.   Musculoskeletal:         General: No tenderness.        Arms:    Lymphadenopathy:      Cervical: No cervical adenopathy.   Skin:     General: Skin is warm and dry.      Coloration: Skin is not pale.      Findings: No erythema or rash.   Neurological:      Cranial Nerves: No cranial nerve deficit.      Motor: No abnormal muscle tone.      Coordination: Coordination normal.      Deep Tendon Reflexes: Reflexes are normal and symmetric. Reflexes normal.   Psychiatric:         Behavior: Behavior normal.         Thought Content: Thought content normal.         Judgment: Judgment normal.         Assessment:      Encounter Diagnoses   Name Primary?   ??? Lateral epicondylitis of left elbow Yes   ??? 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 Routine general medical examination at a health care facility    ??? Carpal tunnel syndrome, unspecified laterality    ??? Need for influenza vaccination            Plan:      Zarian was seen today for annual exam.    Diagnoses and all orders for this visit:    Lateral epicondylitis of left elbow    Uncontrolled type 1 diabetes with diabetic neuropathy (H[facility])  -     insulin glargine (LANTUS SOLOSTAR) 100 UNIT/ML injection pen; Inject 40 Units into the skin nightly  -     sitaGLIPtan-metformin (  JANUMET) 50-500 MG per tablet; Take 1 tablet by mouth 2 times daily (with meals)  -     lisinopril (PRINIVIL;ZESTRIL) 10 MG tablet; Take 1 tablet by mouth daily    Routine general medical examination at a health care facility    Carpal tunnel syndrome, unspecified laterality  -     Ambulatory referral to Orthopedic Surgery    Need for influenza vaccination  -     INFLUENZA, QUADV, 3 YRS AND OLDER, IM PF, PREFILL SYR OR SDV, 0.5ML (AFLURIA QUADV, PF)    Other orders  -     tamsulosin (FLOMAX) 0.4 MG capsule; Take 1 capsule by mouth daily  -     naproxen (NAPROSYN) 500 MG tablet; Take 1 tablet by mouth 2 times daily (with meals)  -     simvastatin (ZOCOR) 40 MG tablet; Take 1 tablet by mouth nightly  -     sildenafil (VIAGRA) 100 MG tablet; Take 1 tablet by mouth as needed for Erectile Dysfunction    67m of Depomedrol and 160mof Xylocain were injected to lt elbow lateral epicondyle without any event.          TaBrennan BaileyMD

## 2018-11-28 ENCOUNTER — Ambulatory Visit
Admit: 2018-11-28 | Discharge: 2018-11-28 | Payer: BLUE CROSS/BLUE SHIELD | Attending: Orthopaedic Surgery | Primary: Internal Medicine

## 2018-11-28 DIAGNOSIS — G5602 Carpal tunnel syndrome, left upper limb: Secondary | ICD-10-CM

## 2018-11-28 NOTE — Progress Notes (Signed)
[name] is a 58 y.o. right handed man  who is seen today in Hand Surgical Consultation at the request of Brennan Bailey, MD.    He presents today regarding Left greater than Right symptoms which have been present for approximately 2 years.  A history of antecedent trauma or injury is Absent.  He reports symptoms to include moderate numbness & tingling in the Whole Hand.  Hand symptoms do Seldom awaken him from sleep.  He reports mild pain located in the palmar left wrist. Symptoms are worsening over time.      Previous treatment has included conservative measures and splinting of the left wrist.  He does not claim relation of his symptoms to his required work activities.  He has undergone electrodiagnostic testing.    I have today reviewed with Wynona Canes the clinically relevant, past medical history, medications, allergies,  family history, social history, and Review Of Systems & I have documented any details relevant to today's presenting complaints in my history above.  Mr. Lynard Postlewait Breton's self-reported past medical history, medications, allergies,  family history, social history, and Review Of Systems have been scanned into the chart under the "Media" tab.    Physical Exam:  Mr. Trell Secrist Molla's most recent vitals:  Vitals  Temp: 97.4 ??F (36.3 ??C)  Temp Source: Infrared  Resp: 16  Height: 6' (182.9 cm)  Weight: 246 lb (111.6 kg)    He is well nourished, oriented to person, place & time.  He demonstrates appropriate mood and affect as well as normal gait and station.    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  Sensation is subjectively tingling in the Whole Hand on the Left, greater than Right and objectively present in the same distribution  bilaterally.  Vascular examination reveals normal and good capillary refill bilaterally  Swelling is mild in the distal volar forearm on the Left, greater than Right  Muscular strength is clinically appropriate bilaterally.  Examination for Carpal Tunnel Syndrome shows Carpal Tunnel Compression Test to be Mildly Positive on the right & Moderately Positive on the left.  The patient displays mild baseline symptoms to potentially confound the exam.  The thenar musculature is not atrophied & weakened.  Examination for Stenosing Tenosynovitis demonstrates no evidence of tenderness, thickening or nodularity at the A-1 pulleys of the digits bilaterally.  There no palpable triggering or any finger or thumb.  Examination of the first dorsal extensor compartments of the wrist demonstrates no thickening or fullness.  There is no tenderness to palpation over the extensor tendons.  Pain is not elicited with resisted thumb extension, and Finklestein's maneuver is negative bilaterally.        Review of Electrodiagnostic Testing:  Test performed on: 01/16/18    NERVE CONDUCTION STUDY:  RIGHT   Median Nerve: Sensory Latency:    Motor Latency:    Ulnar Nerve:  Conduction Velocity:       LEFT  Median Nerve: Sensory Latency: 4.0  Motor Latency: 8.5  Ulnar Nerve:  Conduction Velocity:  60    EMG:  RIGHT  Not Performed    LEFT  Normal        Impression:  Mr. JAZE RODINO is showing clinical evidence of Carpal Tunnel  Syndrome and presents requesting further treatment.    Plan:    I have had a thorough discussion with Mr. ANTAEUS KAREL regarding the treatment options available for his initially presenting carpal tunnel syndrome, which is causing him significant  limitations.  I have outlined for Mr. JUANJOSE MOJICA the benefits and consequences of the various treatment modalities, including the fact that surgical treatment is the only modality which is reasonably expected to provide long lasting or permanent resolution of his symptoms.  Based  upon our current discussion and a reasonable understating of the options available to him, Mr. DAMONTAE LOPPNOW has selected to proceed with surgical Carpal Tunnel Release.  I have discussed the details of the surgical procedure, the pre, peri and postoperative concerns and the appropriate expectations after surgery with Mr. CAYMEN DUBRAY today. He was given the opportunity to ask questions, voiced an understanding of the procedure, and he did wish to proceed with Left Carpal Tunnel Release.       I had an extensive discussion with Mr. KAYODE PETION (and any family members present with him today) regarding the natural history, etiology, and long term consequences of this problem.  We have mutually selected a surgical treatment plan  and, in my opinion, surgical intervention is indicated at this time. I have discussed with him the potential complications, limitations, expectations, alternatives, and risks of Carpal Tunnel Release.  He has had full opportunity to ask his questions. I have answered them all to his satisfaction. I feel that Mr. DAYMEN HASSEBROCK (and any family members present with him today) do understand our discussion today and he has provided informed consent for Left Carpal Tunnel Release.     I have also discussed with Mr. LEOVARDO THOMAN the other treatment options available to him for this condition.  We have today selected to proceed with Surgical treatment.  He has voiced and  understanding that there are other less aggressive treatment options which are available in this situation, albeit possibly less efficacious or durable, and he is comfortable with the plan that he has chosen.       Mr. MIKYLE SOX has been given a full verbal list of instructions and precautions related to his present condition.  I have asked him to followup with me in the office at the prescribed time. He is also specifically requested to call or return to the office sooner if his symptoms change or worsen prior to the next  scheduled appointment.

## 2018-11-28 NOTE — Patient Instructions (Signed)
Pre-Operative Instructions    1. The night before your surgery, unless otherwise instructed, do not eat any food, drink any liquids, chew gum or mints after midnight. Abstain from alcohol for 24 hours prior to surgery.     2. You will be contacted by the Hospital the working day prior to your procedure to confirm your arrival time.     3. Patients under 60 years of age must have a parent or legal guardian present to sign their consent and discharge paperwork.     4. On the day of surgery,  you will be seen pre-operatively by an anesthesiologist.     5. If you are having hand surgery, it is recommended that nail polish and acrylic nails be removed prior to surgery if possible.     6. Please bring cases for glasses, contact lenses, hearing aids or dentures. They will likely be removed prior to surgery.     7. Wear casual, loose-fitting and comfortable clothing. Consider that you may have a large dressing to fit under your clothing after surgery.     9. Please do not bring valuables such as jewelry or large sums of cash to the hospital. Remove all body piercings before coming to the hospital. You may not  wear any rings on the hand if you are having surgery on that hand, wrist or elbow.     10. Do not smoke or chew tobacco before your surgery. All [facility] and surgery facilities are smoke-free environments. Smoking is not permitted anywhere on campus.     11. Be sure to follow any additional instructions from your physician.       If the above conditions are not met, your surgery may be cancelled and rescheduled for another day.      Should you develop any change in your health such as fever, cough, sore throat, cold, flu, or infection, or if you have any questions regarding your Pre-admission or surgery, please contact [facility] - Surgery Scheduling at [phone], Monday through Friday, 9 a.m. to 5 p.m.

## 2018-12-02 NOTE — Telephone Encounter (Signed)
CPT: 64721  BODY PART: left wrist  AUTHORIZATION: NPR    Per website, NPR

## 2018-12-19 NOTE — Telephone Encounter (Signed)
Pt had a physical 11-07-18. He has a simple form to be filled out for a carpel tunnel procedure and wants it filled out per that visit. Form is on your desk. Can you sign this based on visit from Oct?

## 2018-12-19 NOTE — Telephone Encounter (Signed)
Done, pt informed.

## 2018-12-23 NOTE — Progress Notes (Signed)
Phone message left to call PAT dept at [phone]  for history review and surgery instructions on 12/23/18 @ 1238

## 2018-12-24 NOTE — Progress Notes (Signed)
C-Difficile admission screening and protocol:     * Admitted with diarrhea? no     *Prior history of C-Diff. In last 3 months? no     *Antibiotic use in the past 6-8 weeks? no     *Prior hospitalization or nursing home in the last month?  no

## 2018-12-24 NOTE — Progress Notes (Signed)
[facility] PRE-OPER[facility]IVE INSTRUCTIONS    Arrival time_0845___________        Surgery time____1000________    Take the following medications with a sip of water: Follow your MD/Surgeons pre-procedure instructions regarding your medications    Do not eat or drink anything after 12:00 midnight prior to your surgery.  This includes water chewing gum, mints and ice chips.   You may brush your teeth and gargle the morning of your surgery, but do not swallow the water     Please see your family doctor/pediatrician for a history and physical and/or concerning medications.   Bring any test results/reports from your physicians office.   If you are under the care of a heart doctor or specialist doctor, please be aware that you may be asked to them for clearance    You may be asked to stop blood thinners such as Coumadin, Plavix, Fragmin, Lovenox, etc., or any anti-inflammatories such as:  Aspirin, Ibuprofen, Advil, Naproxen prior to your surgery.    We also ask that you stop any OTC medications such as fish oil, vitamin E, glucosamine, garlic, Multivitamins, COQ 10, etc. May take tylenol    We ask that you do not smoke 24 hours prior to surgery  We ask that you do not  drink any alcoholic beverages 24 hours prior to surgery     You must make arrangements for a responsible adult to take you home after your surgery.    For your safety you will not be allowed to leave alone or drive yourself home.  Your surgery will be cancelled if you do not have a ride home.     Also for your safety, it is strongly suggested that someone stay with you the first 24 hours after your surgery.     A parent or legal guardian must accompany a child scheduled for surgery and plan to stay at the hospital until the child is discharged.    Please do not bring other children with you.    For your comfort, please wear simple loose fitting clothing to the hospital.  Please do not bring valuables.    Do not wear any make-up or nail polish on your  fingers or toes      For your safety, please do not wear any jewelry or body piercing's on the day of surgery.   All jewelry must be removed.      If you have dentures, they will be removed before going to operating room.    For your convenience, we will provide you with a container.    If you wear contact lenses or glasses, they will be removed, please bring a case for them.     If you have a living will and a durable power of attorney for healthcare, please bring in a copy.     As part of our patient safety program to minimize surgical site infections, we ask you to do the following:     Please notify your surgeon if you develop any illness between         now and the  day of your surgery.     This includes a cough, cold, fever, sore throat, nausea,         or vomiting, and diarrhea, etc.    Please notify your surgeon if you experience dizziness, shortness         of breath or blurred vision between now and the time of your surgery.  Do not shave your operative site 96 hours prior to surgery.   For face and neck surgery, men may use an electric razor 48 hours   prior to surgery.    You may shower the night before surgery or the morning of   your surgery with an antibacterial soap.    You will need to bring a photo ID and insurance card    [facility] has an onsite pharmacy, would you like to utilize our pharmacy     If you will be staying overnight and use a C-pap machine, please bring   your C-pap to hospital     Our goal is to provide you with excellent care, therefore, visitors will be limited to two(2) in the room at a time so that we may focus on providing this care for you.          Please contact pre-admission testing if you have any further questions.                 [facility] phone number:  [phone]     [facility] PAT fax number:  [phone]  Please note these are generalized instructions for all surgical cases, you may be provided with more specific instructions according to your surgery.

## 2018-12-25 ENCOUNTER — Inpatient Hospital Stay: Payer: BLUE CROSS/BLUE SHIELD

## 2018-12-25 ENCOUNTER — Encounter: Admit: 2018-12-25 | Payer: BLUE CROSS/BLUE SHIELD | Attending: Orthopaedic Surgery | Primary: Internal Medicine

## 2018-12-25 LAB — POCT GLUCOSE
POC Glucose: 214 mg/dl — ABNORMAL HIGH (ref 70–99)
POC Glucose: 156 mg/dl — ABNORMAL HIGH (ref 70–99)

## 2018-12-25 MED ORDER — PROPOFOL 200 MG/20ML IV EMUL
200 | INTRAVENOUS | Status: AC
Start: 2018-12-25 — End: 2018-12-25

## 2018-12-25 MED ORDER — NORMAL SALINE FLUSH 0.9 % IV SOLN
0.9 | Freq: Two times a day (BID) | INTRAVENOUS | Status: DC
Start: 2018-12-25 — End: 2018-12-25

## 2018-12-25 MED ORDER — LIDOCAINE HCL 1% INJ (MIXTURES ONLY)
1 % | Freq: Once | INTRAMUSCULAR | Status: AC | PRN
Start: 2018-12-25 — End: 2018-12-25
  Administered 2018-12-25: 15:00:00 3 via INTRAMUSCULAR

## 2018-12-25 MED ORDER — LIDOCAINE HCL (PF) 2 % IJ SOLN
2 | INTRAMUSCULAR | Status: AC
Start: 2018-12-25 — End: 2018-12-25

## 2018-12-25 MED ORDER — LIDOCAINE HCL (PF) 2 % IJ SOLN
2 % | INTRAMUSCULAR | Status: DC | PRN
Start: 2018-12-25 — End: 2018-12-25
  Administered 2018-12-25: 15:00:00 80 via INTRAVENOUS

## 2018-12-25 MED ORDER — SODIUM CHLORIDE 0.9 % IV SOLN
0.9 | INTRAVENOUS | Status: DC
Start: 2018-12-25 — End: 2018-12-25
  Administered 2018-12-25: 15:00:00 via INTRAVENOUS

## 2018-12-25 MED ORDER — NORMAL SALINE FLUSH 0.9 % IV SOLN
0.9 | INTRAVENOUS | Status: DC | PRN
Start: 2018-12-25 — End: 2018-12-25

## 2018-12-25 MED ORDER — PROPOFOL 200 MG/20ML IV EMUL
200 MG/20ML | INTRAVENOUS | Status: DC | PRN
Start: 2018-12-25 — End: 2018-12-25
  Administered 2018-12-25: 15:00:00 150 via INTRAVENOUS
  Administered 2018-12-25: 15:00:00 50 via INTRAVENOUS

## 2018-12-25 MED ORDER — PROPOFOL 200 MG/20ML IV EMUL
20020 MG/20ML | INTRAVENOUS | Status: DC | PRN
Start: 2018-12-25 — End: 2018-12-25
  Administered 2018-12-25: 15:00:00 250 via INTRAVENOUS

## 2018-12-25 MED FILL — XYLOCAINE-MPF 2 % IJ SOLN: 2 % | INTRAMUSCULAR | Qty: 5

## 2018-12-25 MED FILL — DIPRIVAN 200 MG/20ML IV EMUL: 200 MG/20ML | INTRAVENOUS | Qty: 20

## 2018-12-25 NOTE — Op Note (Signed)
OPERATIVE REPORT          Patient:  Jeremiah Guzman    Date of Birth:  11-29-1960  Date of Service:  12/25/2018   Location:  [facility]    Preoperative Diagnosis:    Left carpal tunnel syndrome    Postoperative Diagnosis:    Same    Procedure:    Left carpal tunnel release      Surgeon:    Tasia Catchings B. Anne Hahn, MD    Surgical Assistant:    Gregery Na, PA-C    Anesthesia:   Local with Sedation    Blood Loss:   Minimal    Complications:  None    Tourniquet Time: 3 minutes     Indications:  [name]  is a 58 y.o. year-old male with Left carpal tunnel syndrome. I have discussed preoperatively with him  the complications, limitations, expectations, alternatives and risks of surgical care, which he has understood.  All of his questions have been fully answered, and he has provided written informed consent to proceed.    Procedure:   After written consent was obtained and the proper operative site was identified and marked, [name] was brought to the operating room, placed in the supine position on the operating room table with the Left arm extended upon a hand table.  Under an appropriate level of sedation, local anesthetic (1% Lidocaine and 1/2% Marcaine both without Epinephrine) was instilled in the planned surgical field. His Left upper extremity was prepped and draped in the usual sterile fashion.     After Esmarch exsanguination, the pneumotourniquet was inflated to 250 mm of mercury.   A 2 cm longitudinal incision was fashioned at the base of the palm, paralleling the longitudinal thenar crease. Dissection was carried carefully through the subcutaneous tissue identifying and protecting the neurovascular structures. The palmar fascia was incised longitudinally, exposing the transverse carpal ligament. The transverse carpal ligament was incised from its proximal to distal most extent, under direct visualization. The terminal 2 cm of antebrachial fascia was similarly incised under direct  visualization. The contents of the carpal tunnel were inspected and found to be free of mass, lesion or other abnormality. Digital palpation revealed no further constriction about the median nerve.      The wound was irrigated copiously with sterile saline for irrigation and the pneumotourniquet was deflated after a period of 3 minutes elevation.  The fingers were immediately pink & well perfused.  Hemostasis was easily obtained with direct pressure and electrocautery and the wound was closed with interrupted sutures.  The wound was dressed with adaptic, dry sterile dressings and a bulky soft hand & wrist dressing was applied. [name]  was awakened from light sedation, having tolerated the procedure without apparent complication.  He  was returned to the recovery room in stable condition.     At the conclusion of the procedure all needle, instrument, and sponge counts were correct.               [name], MD   12/25/2018, 10:33 AM

## 2018-12-25 NOTE — Discharge Instructions (Signed)
Post-Operative Instructions    Carpal Tunnel Release:    ? Keep hand elevated with fingers above eye-level to control swelling.  ? Keep hand and bandage clean and dry.  ? Do not change or unwrap bandage.  Please leave bandage in place until your follow-up appointment.  ? Maintain finger motion by fully straightening and fully bending fingers and thumb at least once an hour (while awake).  This may cause some discomfort, but will not damage surgery.  ? You may use your operated hand for lightweight tasks (e.g. writing, eating, dressing, etc.).  NO LIFTING, CARRYING OR HEAVY USE.    ? Most Patients do not have "Serious Pain" after this procedure and thus most patients do not require prescription pain medication.  ? You may take over the counter medication (Tylenol, Advil, Aleve, etc.) as needed.  If you are unable to tolerate the discomfort after your surgery and the OTC medications do not provide some relief, you may contact our office to discuss other options..    ? Please call the office at [phone]  in 24 [phone] 48 hours to schedule a follow up appointment for approximately 7 - 10 days after surgery.  ? Please call the office at [phone]  if you have any questions or problems.           Miguel Medal B. Matea Stanard, MD

## 2018-12-25 NOTE — Progress Notes (Signed)
Pt to PACU from OR. VSS. IV KVO. Dressing intact. Fingers pink, cap refill less than 3, radial pulses palpable. Continue to monitor.

## 2019-01-05 ENCOUNTER — Ambulatory Visit
Admit: 2019-01-05 | Discharge: 2019-01-05 | Payer: BLUE CROSS/BLUE SHIELD | Attending: Physician Assistant | Primary: Internal Medicine

## 2019-01-05 DIAGNOSIS — G5602 Carpal tunnel syndrome, left upper limb: Secondary | ICD-10-CM

## 2019-02-13 ENCOUNTER — Encounter

## 2019-02-13 MED ORDER — LISINOPRIL 10 MG PO TABS
10 MG | ORAL_TABLET | Freq: Every day | ORAL | 3 refills | Status: DC
Start: 2019-02-13 — End: 2020-03-01

## 2019-02-13 MED ORDER — NAPROXEN 500 MG PO TABS
500 MG | ORAL_TABLET | Freq: Two times a day (BID) | ORAL | 3 refills | Status: DC
Start: 2019-02-13 — End: 2020-03-01

## 2019-02-13 MED ORDER — SIMVASTATIN 40 MG PO TABS
40 MG | ORAL_TABLET | Freq: Every evening | ORAL | 3 refills | Status: DC
Start: 2019-02-13 — End: 2020-03-01

## 2019-02-13 MED ORDER — LANTUS SOLOSTAR 100 UNIT/ML SC SOPN
100 UNIT/ML | Freq: Every evening | SUBCUTANEOUS | 3 refills | Status: DC
Start: 2019-02-13 — End: 2020-03-01

## 2019-02-13 MED ORDER — TAMSULOSIN HCL 0.4 MG PO CAPS
0.4 MG | ORAL_CAPSULE | Freq: Every day | ORAL | 3 refills | Status: DC
Start: 2019-02-13 — End: 2020-03-01

## 2019-02-13 MED ORDER — JANUMET 50-500 MG PO TABS
50-500 MG | ORAL_TABLET | Freq: Two times a day (BID) | ORAL | 3 refills | Status: AC
Start: 2019-02-13 — End: ?

## 2019-02-13 NOTE — Telephone Encounter (Signed)
Pt also needs a new prescription for humalog

## 2019-02-16 MED ORDER — INSULIN LISPRO (1 UNIT DIAL) 100 UNIT/ML SC SOPN
100 UNIT/ML | PEN_INJECTOR | Freq: Three times a day (TID) | SUBCUTANEOUS | 1 refills | Status: DC
Start: 2019-02-16 — End: 2020-02-17

## 2019-04-10 ENCOUNTER — Ambulatory Visit
Admit: 2019-04-10 | Discharge: 2019-04-10 | Payer: BLUE CROSS/BLUE SHIELD | Attending: Internal Medicine | Primary: Internal Medicine

## 2019-04-10 DIAGNOSIS — E1065 Type 1 diabetes mellitus with hyperglycemia: Secondary | ICD-10-CM

## 2019-04-10 DIAGNOSIS — IMO0002 Reserved for concepts with insufficient information to code with codable children: Secondary | ICD-10-CM

## 2019-04-10 NOTE — Progress Notes (Signed)
Jeremiah Guzman (DOB:  [birth year]) is a 59 y.o. male,Established patient, here for evaluation of the following chief complaint(s):  Check-Up (diabetes, hypertension, hyperlpidemia- ) and Lower Back Pain (patient c/o low back pain with movement x 1-2 months.  patient denies injury )      ASSESSMENT/PLAN:  1. Uncontrolled type 1 diabetes with diabetic neuropathy ([place])  2. Essential hypertension  3. Pure hypercholesterolemia  above stable low back pain use prn naprosyn  Outpatient Encounter Medications as of 04/10/2019   Medication Sig Dispense Refill   . insulin lispro, 1 Unit Dial, (HUMALOG KWIKPEN) 100 UNIT/ML SOPN Inject 10 Units into the skin 3 times daily (before meals) 15 pen 1   . tamsulosin (FLOMAX) 0.4 MG capsule Take 1 capsule by mouth daily 90 capsule 3   . naproxen (NAPROSYN) 500 MG tablet Take 1 tablet by mouth 2 times daily (with meals) 180 tablet 3   . simvastatin (ZOCOR) 40 MG tablet Take 1 tablet by mouth nightly 90 tablet 3   . lisinopril (PRINIVIL;ZESTRIL) 10 MG tablet Take 1 tablet by mouth daily 90 tablet 3   . sitaGLIPtan-metFORMIN (JANUMET) 50-500 MG per tablet Take 1 tablet by mouth 2 times daily (with meals) 180 tablet 3   . insulin glargine (LANTUS SOLOSTAR) 100 UNIT/ML injection pen Inject 40 Units into the skin nightly (Patient taking differently: Inject 33 Units into the skin 2 times daily ) 75 mL 3   . FREESTYLE LITE strip USE THREE TIMES A DAY AS NEEDED 300 each 3   . insulin lispro (HUMALOG KWIKPEN) 100 UNIT/ML pen Inject 10 Units into the skin 3 times daily (before meals) (Patient taking differently: Inject 10 Units into the skin 3 times daily (before meals) Sliding scale) 15 pen 3   . glucose monitoring kit (FREESTYLE) monitoring kit 1 kit by Does not apply route three times daily 1 kit 0   . Lancets MISC Test TID with freestyle meter 300 each 3   . Insulin Pen Needle (B-D ULTRAFINE III SHORT PEN) 31G X 8 MM MISC Patient test BID  Dx 250.00 300 each 3   . Insulin Syringe-Needle U-100  31G X 5/16" 1 ML MISC 1 each by Does not apply route daily 100 each 5   . Multiple Vitamin TABS Take 1 tablet by mouth daily      . aspirin 81 MG tablet Take 81 mg by mouth daily.     . sildenafil (VIAGRA) 100 MG tablet Take 1 tablet by mouth as needed for Erectile Dysfunction 30 tablet 3     No facility-administered encounter medications on file as of 04/10/2019.      Orders Placed This Encounter   Procedures   . Basic Metabolic Panel     Standing Status:   Future     Standing Expiration Date:   04/09/2020   . Lipid Panel     Standing Status:   Future     Standing Expiration Date:   04/09/2020     Order Specific Question:   Is Patient Fasting?/# of Hours     Answer:   12   . AST     Standing Status:   Future     Standing Expiration Date:   04/09/2020   . ALT     Standing Status:   Future     Standing Expiration Date:   04/09/2020   . Hemoglobin A1C     Standing Status:   Future     Standing Expiration Date:  04/09/2020   . Microalbumin / Creatinine Urine Ratio     Standing Status:   Future     Standing Expiration Date:   04/09/2020       No follow-ups on file.    SUBJECTIVE/OBJECTIVE:  HPI   Chief Complaint   Patient presents with   . Check-Up     diabetes, hypertension, hyperlpidemia-    . Lower Back Pain     patient c/o low back pain with movement x 1-2 months.  patient denies injury    . 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 male with the following history as recorded in EpicCare:  Patient Active Problem List    Diagnosis Date Noted   . Diabetes type 1, uncontrolled ([place])    . Cervical lymphadenopathy 12/18/2011   . Type 1 diabetes mellitus, uncontrolled ([place]) 04/10/2011   . Jaw swelling 02/25/2010   . Jaw pain 02/25/2010   . Cancer of skin    . Obesity 03/27/2008   . Hyperlipidemia 03/27/2008   . HTN (hypertension) 03/27/2008     Current Outpatient Medications   Medication Sig Dispense Refill   . insulin lispro, 1 Unit Dial, (HUMALOG  KWIKPEN) 100 UNIT/ML SOPN Inject 10 Units into the skin 3 times daily (before meals) 15 pen 1   . tamsulosin (FLOMAX) 0.4 MG capsule Take 1 capsule by mouth daily 90 capsule 3   . naproxen (NAPROSYN) 500 MG tablet Take 1 tablet by mouth 2 times daily (with meals) 180 tablet 3   . simvastatin (ZOCOR) 40 MG tablet Take 1 tablet by mouth nightly 90 tablet 3   . lisinopril (PRINIVIL;ZESTRIL) 10 MG tablet Take 1 tablet by mouth daily 90 tablet 3   . sitaGLIPtan-metFORMIN (JANUMET) 50-500 MG per tablet Take 1 tablet by mouth 2 times daily (with meals) 180 tablet 3   . insulin glargine (LANTUS SOLOSTAR) 100 UNIT/ML injection pen Inject 40 Units into the skin nightly (Patient taking differently: Inject 33 Units into the skin 2 times daily ) 75 mL 3   . FREESTYLE LITE strip USE THREE TIMES A DAY AS NEEDED 300 each 3   . insulin lispro (HUMALOG KWIKPEN) 100 UNIT/ML pen Inject 10 Units into the skin 3 times daily (before meals) (Patient taking differently: Inject 10 Units into the skin 3 times daily (before meals) Sliding scale) 15 pen 3   . glucose monitoring kit (FREESTYLE) monitoring kit 1 kit by Does not apply route three times daily 1 kit 0   . Lancets MISC Test TID with freestyle meter 300 each 3   . Insulin Pen Needle (B-D ULTRAFINE III SHORT PEN) 31G X 8 MM MISC Patient test BID  Dx 250.00 300 each 3   . Insulin Syringe-Needle U-100 31G X 5/16" 1 ML MISC 1 each by Does not apply route daily 100 each 5   . Multiple Vitamin TABS Take 1 tablet by mouth daily      . aspirin 81 MG tablet Take 81 mg by mouth daily.     . sildenafil (VIAGRA) 100 MG tablet Take 1 tablet by mouth as needed for Erectile Dysfunction 30 tablet 3     No current facility-administered medications for this visit.      Allergies: Patient has no known allergies.  Past Medical History:   Diagnosis Date   . Basal cell carcinoma 08/2013  left nose   . Cancer of skin    . Diabetes type 1, uncontrolled ([place])    . Erectile dysfunction    . HBP (high blood  pressure) 03/27/2008   . Hyperlipidemia 03/27/2008   . Obesity 03/27/2008     Past Surgical History:   Procedure Laterality Date   . CARPAL TUNNEL RELEASE Left 12/25/2018    LEFT CARPAL TUNNEL RELEASE performed by Delene Loll, MD at [facility]   . COLONOSCOPY  01-06-2007    neg Dr. Beatrix Shipper   . COLONOSCOPY N/A 02/18/2018    COLORECTAL CANCER SCREENING, NOT HIGH RISK performed by Orvan Seen, MD at [facility]   . SKIN BIOPSY     . SKIN CANCER EXCISION Left 08/2013    nose   . TONSILLECTOMY       Family History   Problem Relation Age of Onset   . Cancer Mother         colon   . Colon Cancer Mother    . Cancer Father         skin   . Asthma Sister    . Diabetes Paternal Grandmother    . Heart Attack Maternal Grandfather 35     Social History     Tobacco Use   . Smoking status: Never Smoker   . Smokeless tobacco: Former Systems developer     Types: Chew   Substance Use Topics   . Alcohol use: Yes     Comment: weekends       Review of Systems   Constitutional: Negative for chills, diaphoresis, fatigue and fever.   HENT: Negative for congestion, postnasal drip, rhinorrhea, sinus pressure, sinus pain and sore throat.    Eyes: Negative for visual disturbance.   Respiratory: Negative for apnea, cough, shortness of breath and wheezing.    Cardiovascular: Negative for chest pain and palpitations.   Gastrointestinal: Negative for abdominal pain, blood in stool, constipation, diarrhea, nausea and vomiting.   Endocrine: Negative for polyuria.   Genitourinary: Negative for dysuria, frequency, hematuria and urgency.   Musculoskeletal: Positive for arthralgias and back pain. Negative for myalgias.   Skin: Negative for rash.   Neurological: Negative for dizziness, weakness, numbness and headaches.   Hematological: Negative for adenopathy.       Physical Exam  Vitals signs and nursing note reviewed.   Constitutional:       Appearance: Normal appearance.   HENT:      Head: Normocephalic and atraumatic.      Right Ear: Tympanic membrane, ear  canal and external ear normal.      Left Ear: Tympanic membrane, ear canal and external ear normal.   Eyes:      Extraocular Movements: Extraocular movements intact.      Pupils: Pupils are equal, round, and reactive to light.   Neck:      Musculoskeletal: No neck rigidity.   Cardiovascular:      Rate and Rhythm: Normal rate and regular rhythm.      Heart sounds: No murmur.   Pulmonary:      Effort: No respiratory distress.      Breath sounds: No wheezing.   Abdominal:      General: There is no distension.      Tenderness: There is no abdominal tenderness. There is no guarding.   Musculoskeletal:         General: No swelling.   Skin:     Coloration: Skin is not jaundiced.  Findings: No bruising or rash.   Neurological:      General: No focal deficit present.      Mental Status: He is alert and oriented to person, place, and time.      Cranial Nerves: No cranial nerve deficit.      Motor: No weakness.   Psychiatric:         Mood and Affect: Mood normal.                 An electronic signature was used to authenticate this note.    --Wilhemina Cash, DO

## 2019-04-10 NOTE — Progress Notes (Signed)
Immunization History   Administered Date(s) Administered   ??? Influenza 10/11/2011   ??? Influenza Virus Vaccine 11/20/2012, 12/10/2013   ??? Influenza, Quadv, IM, PF (6 mo and older Fluzone, Flulaval, Fluarix, and 3 yrs and older Afluria) 12/13/2014, 11/22/2015, 11/15/2016, 11/01/2017, 11/07/2018

## 2019-04-13 ENCOUNTER — Encounter

## 2019-04-13 LAB — ALT: ALT: 32 U/L (ref 10–40)

## 2019-04-13 LAB — BASIC METABOLIC PANEL
Anion Gap: 12 (ref 3–16)
BUN: 13 mg/dL (ref 7–20)
CO2: 28 mmol/L (ref 21–32)
Calcium: 8.9 mg/dL (ref 8.3–10.6)
Chloride: 102 mmol/L (ref 99–110)
Creatinine: 0.9 mg/dL (ref 0.8–1.3)
GFR African American: >60 (ref >60)
GFR Non-African American: >60 (ref >60)
Glucose: 107 mg/dL — ABNORMAL HIGH (ref 70–99)
Potassium: 4.7 mmol/L (ref 3.5–5.1)
Sodium: 142 mmol/L (ref 136–145)

## 2019-04-13 LAB — LIPID PANEL
Cholesterol, Total: 165 mg/dL (ref 0–199)
HDL: 50 mg/dL (ref 40–60)
LDL Calculated: 96 mg/dL (ref <100)
Triglycerides: 96 mg/dL (ref 0–150)
VLDL Cholesterol Calculated: 19 mg/dL

## 2019-04-13 LAB — AST: AST: 30 U/L (ref 15–37)

## 2019-04-14 LAB — HEMOGLOBIN A1C
Estimated Avg Glucose: 191.5 mg/dL
Hemoglobin A1C: 8.3 %

## 2019-06-10 ENCOUNTER — Encounter: Attending: Internal Medicine

## 2020-02-17 ENCOUNTER — Ambulatory Visit: Admit: 2020-02-17 | Discharge: 2020-02-17 | Payer: PRIVATE HEALTH INSURANCE | Attending: Internal Medicine

## 2020-02-17 DIAGNOSIS — E1040 Type 1 diabetes mellitus with diabetic neuropathy, unspecified: Secondary | ICD-10-CM

## 2020-02-17 DIAGNOSIS — IMO0002 Reserved for concepts with insufficient information to code with codable children: Secondary | ICD-10-CM

## 2020-02-17 NOTE — Progress Notes (Signed)
Jeremiah Guzman (DOB:  May 22, 1960) is a 60 y.o. male,Established patient, here for evaluation of the following chief complaint(s):  Check-Up (diabetes, hypertension, hyperlipidemia- patient changed his insulin doses), Numbness (patient c/o numbness in toes and right fingers), and Back Pain (patient c/o "sciatic" pain; bilateral leg pain )  never returned for appointment as directed states that he was in [place] .       ASSESSMENT/PLAN:  1. Uncontrolled type 1 diabetes with diabetic neuropathy ([place])  2. Essential hypertension  3. Pure hypercholesterolemia  4. Screening PSA (prostate specific antigen)  bp stable   Diagnosis Orders   1. Uncontrolled type 1 diabetes with diabetic neuropathy ([facility])  Hemoglobin A1C   2. Essential hypertension  Basic Metabolic Panel   3. Pure hypercholesterolemia  Lipid Panel    AST    ALT   4. Screening PSA (prostate specific antigen)  PSA screening   5. Flu vaccine need     6. Chronic bilateral low back pain without sciatica       Outpatient Encounter Medications as of 02/17/2020   Medication Sig Dispense Refill   . tamsulosin (FLOMAX) 0.4 MG capsule Take 1 capsule by mouth daily 90 capsule 3   . naproxen (NAPROSYN) 500 MG tablet Take 1 tablet by mouth 2 times daily (with meals) 180 tablet 3   . simvastatin (ZOCOR) 40 MG tablet Take 1 tablet by mouth nightly 90 tablet 3   . lisinopril (PRINIVIL;ZESTRIL) 10 MG tablet Take 1 tablet by mouth daily 90 tablet 3   . sitaGLIPtan-metFORMIN (JANUMET) 50-500 MG per tablet Take 1 tablet by mouth 2 times daily (with meals) 180 tablet 3   . insulin glargine (LANTUS SOLOSTAR) 100 UNIT/ML injection pen Inject 40 Units into the skin nightly (Patient taking differently: Inject 50 Units into the skin daily ) 75 mL 3   . FREESTYLE LITE strip USE THREE TIMES A DAY AS NEEDED 300 each 3   . insulin lispro (HUMALOG KWIKPEN) 100 UNIT/ML pen Inject 10 Units into the skin 3 times daily (before meals) (Patient taking differently: Inject 10-18 Units into the skin  daily Sliding scale) 15 pen 3   . glucose monitoring kit (FREESTYLE) monitoring kit 1 kit by Does not apply route three times daily 1 kit 0   . Lancets MISC Test TID with freestyle meter 300 each 3   . Insulin Pen Needle (B-D ULTRAFINE III SHORT PEN) 31G X 8 MM MISC Patient test BID  Dx 250.00 300 each 3   . Insulin Syringe-Needle U-100 31G X 5/16" 1 ML MISC 1 each by Does not apply route daily 100 each 5   . Multiple Vitamin TABS Take 1 tablet by mouth daily      . aspirin 81 MG tablet Take 81 mg by mouth daily.     . [DISCONTINUED] insulin lispro, 1 Unit Dial, (HUMALOG KWIKPEN) 100 UNIT/ML SOPN Inject 10 Units into the skin 3 times daily (before meals) 15 pen 1   . sildenafil (VIAGRA) 100 MG tablet Take 1 tablet by mouth as needed for Erectile Dysfunction 30 tablet 3     No facility-administered encounter medications on file as of 02/17/2020.     Orders Placed This Encounter   Procedures   . INFLUENZA, MDCK QUADV, 2 YRS AND OLDER, IM, PF, PREFILL SYR OR SDV, 0.5ML (FLUCELVAX QUADV, PF)   . Basic Metabolic Panel     Standing Status:   Future     Standing Expiration Date:   02/16/2021   .  Lipid Panel     Standing Status:   Future     Standing Expiration Date:   02/16/2021     Order Specific Question:   Is Patient Fasting?/# of Hours     Answer:   12   . Hemoglobin A1C     Standing Status:   Future     Standing Expiration Date:   02/16/2021   . AST     Standing Status:   Future     Standing Expiration Date:   02/16/2021   . ALT     Standing Status:   Future     Standing Expiration Date:   02/16/2021   . PSA screening     Standing Status:   Future     Standing Expiration Date:   02/16/2021   will refer to PT for back pain .    No follow-ups on file.         Subjective   SUBJECTIVE/OBJECTIVE:  HPI   Chief Complaint   Patient presents with   . Check-Up     diabetes, hypertension, hyperlipidemia- patient changed his insulin doses   . Numbness     patient c/o numbness in toes and right fingers   . Back Pain     patient c/o "sciatic"  pain; bilateral leg pain      Jeremiah Guzman is a 60 y.o. male with the following history as recorded in EpicCare:  Patient Active Problem List    Diagnosis Date Noted   . Diabetes type 1, uncontrolled ([place])    . Cervical lymphadenopathy 12/18/2011   . Type 1 diabetes mellitus, uncontrolled ([place]) 04/10/2011   . Jaw swelling 02/25/2010   . Jaw pain 02/25/2010   . Cancer of skin    . Obesity 03/27/2008   . Hyperlipidemia 03/27/2008   . HTN (hypertension) 03/27/2008     Current Outpatient Medications   Medication Sig Dispense Refill   . tamsulosin (FLOMAX) 0.4 MG capsule Take 1 capsule by mouth daily 90 capsule 3   . naproxen (NAPROSYN) 500 MG tablet Take 1 tablet by mouth 2 times daily (with meals) 180 tablet 3   . simvastatin (ZOCOR) 40 MG tablet Take 1 tablet by mouth nightly 90 tablet 3   . lisinopril (PRINIVIL;ZESTRIL) 10 MG tablet Take 1 tablet by mouth daily 90 tablet 3   . sitaGLIPtan-metFORMIN (JANUMET) 50-500 MG per tablet Take 1 tablet by mouth 2 times daily (with meals) 180 tablet 3   . insulin glargine (LANTUS SOLOSTAR) 100 UNIT/ML injection pen Inject 40 Units into the skin nightly (Patient taking differently: Inject 50 Units into the skin daily ) 75 mL 3   . FREESTYLE LITE strip USE THREE TIMES A DAY AS NEEDED 300 each 3   . insulin lispro (HUMALOG KWIKPEN) 100 UNIT/ML pen Inject 10 Units into the skin 3 times daily (before meals) (Patient taking differently: Inject 10-18 Units into the skin daily Sliding scale) 15 pen 3   . glucose monitoring kit (FREESTYLE) monitoring kit 1 kit by Does not apply route three times daily 1 kit 0   . Lancets MISC Test TID with freestyle meter 300 each 3   . Insulin Pen Needle (B-D ULTRAFINE III SHORT PEN) 31G X 8 MM MISC Patient test BID  Dx 250.00 300 each 3   . Insulin Syringe-Needle U-100 31G X 5/16" 1 ML MISC 1 each by Does not apply route daily 100 each 5   . Multiple Vitamin TABS Take 1  tablet by mouth daily      . aspirin 81 MG tablet Take 81 mg by mouth daily.      . sildenafil (VIAGRA) 100 MG tablet Take 1 tablet by mouth as needed for Erectile Dysfunction 30 tablet 3     No current facility-administered medications for this visit.     Allergies: Patient has no known allergies.  Past Medical History:   Diagnosis Date   . Basal cell carcinoma 08/2013    left nose   . Cancer of skin    . Diabetes type 1, uncontrolled ([place])    . Erectile dysfunction    . HBP (high blood pressure) 03/27/2008   . Hyperlipidemia 03/27/2008   . Obesity 03/27/2008     Past Surgical History:   Procedure Laterality Date   . CARPAL TUNNEL RELEASE Left 12/25/2018    LEFT CARPAL TUNNEL RELEASE performed by Delene Loll, MD at [facility]   . COLONOSCOPY  01-06-2007    neg Dr. Beatrix Shipper   . COLONOSCOPY N/A 02/18/2018    COLORECTAL CANCER SCREENING, NOT HIGH RISK performed by Orvan Seen, MD at [facility]   . SKIN BIOPSY     . SKIN CANCER EXCISION Left 08/2013    nose   . TONSILLECTOMY       Family History   Problem Relation Age of Onset   . Cancer Mother         colon   . Colon Cancer Mother    . Cancer Father         skin   . Asthma Sister    . Diabetes Paternal Grandmother    . Heart Attack Maternal Grandfather 18     Social History     Tobacco Use   . Smoking status: Never Smoker   . Smokeless tobacco: Former Systems developer     Types: Chew   Substance Use Topics   . Alcohol use: Yes     Comment: weekends       Review of Systems   Constitutional: Negative for chills, diaphoresis and fatigue.   HENT: Negative for congestion, postnasal drip, rhinorrhea, sinus pressure, sinus pain and sore throat.    Eyes: Negative for visual disturbance.   Respiratory: Negative for apnea, cough, shortness of breath and wheezing.    Cardiovascular: Negative for chest pain and palpitations.   Gastrointestinal: Negative for abdominal pain, blood in stool, constipation, diarrhea and nausea.   Endocrine: Negative for polyuria.   Genitourinary: Negative for dysuria and frequency.   Musculoskeletal: Positive for back pain. Negative for  arthralgias.        Patient presents with:  Check-Up: diabetes, hypertension, hyperlipidemia- patient changed his insulin doses  Numbness: patient c/o numbness in toes and right fingers  Back Pain: patient c/o "sciatic" pain; bilateral leg pain      Skin: Negative for rash.   Neurological:        Patient presents with:  Check-Up: diabetes, hypertension, hyperlipidemia- patient changed his insulin doses  Numbness: patient c/o numbness in toes and right fingers  Back Pain: patient c/o "sciatic" pain; bilateral leg pain             Objective   Physical Exam  Vitals and nursing note reviewed.   Constitutional:       General: He is not in acute distress.     Appearance: Normal appearance. He is not ill-appearing or diaphoretic.   HENT:      Head: Normocephalic and  atraumatic.      Right Ear: Tympanic membrane, ear canal and external ear normal.      Left Ear: Tympanic membrane, ear canal and external ear normal.   Eyes:      General:         Right eye: No discharge.         Left eye: No discharge.      Pupils: Pupils are equal, round, and reactive to light.   Cardiovascular:      Rate and Rhythm: Normal rate and regular rhythm.      Heart sounds: No murmur heard.      Pulmonary:      Effort: Pulmonary effort is normal. No respiratory distress.      Breath sounds: Normal breath sounds. No wheezing.   Abdominal:      General: There is no distension.      Tenderness: There is no abdominal tenderness. There is no guarding or rebound.   Musculoskeletal:         General: No swelling or deformity.      Cervical back: No rigidity.   Skin:     Coloration: Skin is not jaundiced.      Findings: No rash.   Neurological:      Mental Status: He is alert.      Cranial Nerves: No cranial nerve deficit.      Motor: No weakness.      Gait: Gait normal.                  An electronic signature was used to authenticate this note.    --Wilhemina Cash, DO

## 2020-02-17 NOTE — Progress Notes (Signed)
Immunization History   Administered Date(s) Administered   ??? Influenza 10/11/2011   ??? Influenza Virus Vaccine 11/20/2012, 12/10/2013   ??? Influenza, Quadv, IM, PF (6 mo and older Fluzone, Flulaval, Fluarix, and 3 yrs and older Afluria) 12/13/2014, 11/22/2015, 11/15/2016, 11/01/2017, 11/07/2018

## 2020-02-17 NOTE — Patient Instructions (Signed)
Thank you for choosing [facility].    Please bring a current list of medications to every appointment.    Please contact your pharmacy for any prescription refill(s) that you are requesting.

## 2020-02-19 ENCOUNTER — Encounter

## 2020-02-19 LAB — LIPID PANEL
Cholesterol, Total: 155 mg/dL (ref 0–199)
HDL: 51 mg/dL (ref 40–60)
LDL Calculated: 74 mg/dL (ref <100)
Triglycerides: 148 mg/dL (ref 0–150)
VLDL Cholesterol Calculated: 30 mg/dL

## 2020-02-19 LAB — BASIC METABOLIC PANEL
Anion Gap: 14 (ref 3–16)
BUN: 10 mg/dL (ref 7–20)
CO2: 25 mmol/L (ref 21–32)
Calcium: 9.3 mg/dL (ref 8.3–10.6)
Chloride: 101 mmol/L (ref 99–110)
Creatinine: 0.7 mg/dL — ABNORMAL LOW (ref 0.8–1.3)
GFR African American: >60 (ref >60)
GFR Non-African American: >60 (ref >60)
Glucose: 171 mg/dL — ABNORMAL HIGH (ref 70–99)
Potassium: 4.4 mmol/L (ref 3.5–5.1)
Sodium: 140 mmol/L (ref 136–145)

## 2020-02-19 LAB — MICROALBUMIN / CREATININE URINE RATIO
Creatinine, Ur: 57.1 mg/dL (ref 39.0–259.0)
Microalbumin Creatinine Ratio: 24.5 mg/g (ref 0.0–30.0)
Microalbumin, Random Urine: 1.4 mg/dL (ref <2.0)

## 2020-02-19 LAB — PSA SCREENING: PSA: 0.65 ng/mL (ref 0.00–4.00)

## 2020-02-19 LAB — ALT: ALT: 36 U/L (ref 10–40)

## 2020-02-19 LAB — AST: AST: 25 U/L (ref 15–37)

## 2020-02-20 LAB — HEMOGLOBIN A1C
Estimated Avg Glucose: 194.4 mg/dL
Hemoglobin A1C: 8.4 %

## 2020-03-01 ENCOUNTER — Encounter

## 2020-03-01 MED ORDER — NAPROXEN 500 MG PO TABS
500 MG | ORAL_TABLET | Freq: Two times a day (BID) | ORAL | 1 refills | Status: AC
Start: 2020-03-01 — End: ?

## 2020-03-01 MED ORDER — LISINOPRIL 10 MG PO TABS
10 MG | ORAL_TABLET | Freq: Every day | ORAL | 1 refills | Status: DC
Start: 2020-03-01 — End: 2020-03-07

## 2020-03-01 MED ORDER — LANTUS SOLOSTAR 100 UNIT/ML SC SOPN
100 UNIT/ML | PEN_INJECTOR | Freq: Every day | SUBCUTANEOUS | 1 refills | Status: DC
Start: 2020-03-01 — End: 2020-03-16

## 2020-03-01 MED ORDER — SIMVASTATIN 40 MG PO TABS
40 | ORAL_TABLET | Freq: Every evening | ORAL | 1 refills | Status: DC
Start: 2020-03-01 — End: 2020-03-07

## 2020-03-01 MED ORDER — TAMSULOSIN HCL 0.4 MG PO CAPS
0.4 MG | ORAL_CAPSULE | Freq: Every day | ORAL | 1 refills | Status: AC
Start: 2020-03-01 — End: ?

## 2020-03-01 NOTE — Telephone Encounter (Signed)
-----   Message from Darrelyn Hillock sent at 03/01/2020  9:19 AM EST -----  Subject: Refill Request    QUESTIONS  Name of Medication? lisinopril (PRINIVIL;ZESTRIL) 10 MG tablet  Patient-reported dosage and instructions? 10 mg 1x a day   How many days do you have left? 3  Preferred Pharmacy? [facility]  Pharmacy phone number (if available)? [phone]  ---------------------------------------------------------------------------  --------------,  Name of Medication? simvastatin (ZOCOR) 40 MG tablet  Patient-reported dosage and instructions? 40 mg 1x a day   How many days do you have left? 10  Preferred Pharmacy? [facility]  Pharmacy phone number (if available)? [phone]  ---------------------------------------------------------------------------  --------------,  Name of Medication? tamsulosin (FLOMAX) 0.4 MG capsule  Patient-reported dosage and instructions? 0.4 mg 1x a day   How many days do you have left? 8  Preferred Pharmacy? [facility]  Pharmacy phone number (if available)? [phone]  ---------------------------------------------------------------------------  --------------,  Name of Medication? naproxen (NAPROSYN) 500 MG tablet  Patient-reported dosage and instructions? 500mg  1 tablet 2x a day   How many days do you have left? 20  Preferred Pharmacy? [facility]  Pharmacy phone number (if available)? [phone]  ---------------------------------------------------------------------------  --------------,  Name of Medication? insulin glargine (LANTUS SOLOSTAR) 100 UNIT/ML   injection pen  Patient-reported dosage and instructions? 50 units 1x a day   How many days do you have left? 4  Preferred Pharmacy? [facility]  Pharmacy phone number (if available)? [phone]  ---------------------------------------------------------------------------  --------------  CALL BACK INFO  What is the best way for the office to contact you?  OK to leave message on   voicemail  Preferred Call Back Phone Number? [phone]

## 2020-03-01 NOTE — Telephone Encounter (Signed)
Pt wants to know if Dr Ottis Stain would write him a rx for a FreeStyle Libre 2 sensor? If so pt uses [facility] in [place]. Pt has a coupon for a free [employer] 2 sensor.

## 2020-03-01 NOTE — Telephone Encounter (Signed)
Patient advised and expressed understanding.

## 2020-03-01 NOTE — Telephone Encounter (Signed)
Labs are stable a1c is elevated continue meds work on [employer]

## 2020-03-01 NOTE — Telephone Encounter (Signed)
-----   Message from Darrelyn Hillock sent at 03/01/2020  9:23 AM EST -----  Subject: Message to Provider    QUESTIONS  Information for Provider? Pt had lab work done at his last appt, he would   like to go over these results with someone. pt also has a question   regarding billing   ---------------------------------------------------------------------------  --------------  CALL BACK INFO  What is the best way for the office to contact you? OK to leave message on   voicemail  Preferred Call Back Phone Number? [phone]  ---------------------------------------------------------------------------  --------------  SCRIPT ANSWERS  Relationship to Patient? Self

## 2020-03-02 MED ORDER — FREESTYLE LIBRE 2 SENSOR MISC
1 refills | Status: AC
Start: 2020-03-02 — End: ?

## 2020-03-02 MED ORDER — FREESTYLE LIBRE 2 READER DEVI
0 refills | Status: AC
Start: 2020-03-02 — End: ?

## 2020-03-02 NOTE — Telephone Encounter (Signed)
Ok to send order

## 2020-03-02 NOTE — Telephone Encounter (Signed)
Sent order(s) as directed to requested pharmacy.  Left message to notify patient.

## 2020-03-03 ENCOUNTER — Inpatient Hospital Stay: Payer: PRIVATE HEALTH INSURANCE

## 2020-03-03 NOTE — Other (Signed)
[facility] - Outpatient Rehabilitation and Therapy, [place]  [address] [place] [facility] [address]  Phone: [phone]   Fax:     [phone]     Physical Therapy  Cancellation/No-show Note  Patient Name:  Jeremiah Guzman  DOB:  [birth year]   Date:  03/03/2020  Cancelled visits to date: 1  No-shows to date: 0    Patient status for today's appointment patient:  [x]   Cancelled  []   Rescheduled appointment  []   No-show     Reason given by patient:  []   Patient ill  []   Conflicting appointment  []   No transportation    []   Conflict with work  []   No reason given  [x]   Other:     Comments:  Patient arrived for PT evaluation.  After being informed of estimated patient's share of costs, patient declined PT at this time.    Phone call information:   []   Phone call made today to patient at _ time at number provided:      []   Patient answered, conversation as follows:    []   Patient did not answer, message left as follows:  []   Phone call not made today    Electronically signed by:  , PT  [phone]

## 2020-03-07 ENCOUNTER — Telehealth

## 2020-03-07 MED ORDER — LISINOPRIL 10 MG PO TABS
10 | ORAL_TABLET | Freq: Every day | ORAL | 1 refills | Status: DC
Start: 2020-03-07 — End: 2020-08-29

## 2020-03-07 MED ORDER — SIMVASTATIN 40 MG PO TABS
40 MG | ORAL_TABLET | Freq: Every evening | ORAL | 1 refills | Status: DC
Start: 2020-03-07 — End: 2020-08-29

## 2020-03-07 NOTE — Telephone Encounter (Signed)
Ok 30 d he is due for an appt.

## 2020-03-07 NOTE — Telephone Encounter (Signed)
Sent prescription(s) as directed to requested pharmacy.  Patient's wife advised and expressed understanding.

## 2020-03-07 NOTE — Telephone Encounter (Signed)
Pt needs a refill on lisinopril (PRINIVIL;ZESTRIL) 10 MG tablet to be sent to [facility] in [place]. Pt has been out of the med for 2 days and needs this sent in ASAP.

## 2020-03-16 ENCOUNTER — Encounter

## 2020-03-16 MED ORDER — LANTUS SOLOSTAR 100 UNIT/ML SC SOPN
100 UNIT/ML | PEN_INJECTOR | Freq: Every day | SUBCUTANEOUS | 1 refills | Status: AC
Start: 2020-03-16 — End: ?

## 2020-05-30 ENCOUNTER — Ambulatory Visit: Admit: 2020-05-30 | Discharge: 2020-05-30 | Payer: PRIVATE HEALTH INSURANCE | Attending: Orthopaedic Surgery

## 2020-05-30 DIAGNOSIS — G5601 Carpal tunnel syndrome, right upper limb: Secondary | ICD-10-CM

## 2020-05-30 NOTE — Progress Notes (Signed)
[name] returns today in follow-up of his previously treated  right Carpal Tunnel Syndrome.  He was last seen by me in November, 2020 at which time he was treated with conservative measures.  He experienced no relief of his initial symptoms.  He  has noticed symptom worsening over the last several years.  He returns today with worsened symptoms of right numbness in the Median Innervated digits and tingling in the Median Innervated digits, requesting further treatment.    HE also reports the interval development and worsening of pain and stiffness of the left Thumb and Ring Finger without antecedent injury    I have today reviewed with Jeremiah Guzman the clinically relevant, past medical history, medications, allergies,  family history, social history, and Review Of Systems & I have documented any details relevant to today's presenting complaints in my history above.  [name]'s self-reported past medical history, medications, allergies,  family history, social history, and Review Of Systems have been scanned into the chart under the "Media" tab.    Physical Exam:  Vitals  Resp: 16  Height: 6' (182.9 cm)  Weight: 251 lb (113.9 kg)  [name] appears well, he is in no apparent distress, he demonstrates appropriate mood & affect.      Skin: Normal in appearance, Normal Color and Free of Lesions Bilateral   Digital range of motion is equal bilaterally   Wrist range of motion is equal bilaterally   There is no evidence of gross joint instability bilaterally.  Sensation is subjectively tingling in the Whole Hand bilaterally and objectively present in the same distribution bilaterally.  Vascular examination reveals good capillary refill and good color bilaterally.  Swelling is absent in the Volar both wrists.  Muscular strength is clinically appropriate bilaterally.  Examination for Carpal Tunnel Syndrome shows Carpal Tunnel Compression Test to be Moderately Positive on the right & Negative on the  left.  The patient displays severe baseline symptoms to potentially confound the exam.  The thenar musculature is mildly atrophied & weakened.    Examination for Stenosing Tenosynovitis demonstrates moderate tenderness, thickening & nodularity at the A-1 pulley(s) of the Left Thumb and Ring Finger.  There is a palpable Nota's Node.  There is Inducible triggering on active flexion with pain.  No Jeremiah digits demonstrate evidence of Stenosing Tenosynovitis.    Review of Electrodiagnostic Testing:  Electrodiagnostic Studies performed by another Physician outside of my practice were Personally Reviewed & Interpreted by myself today.    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 is showing evidence of persistent Carpal Tunnel Syndrome and trigger fingers after previous treatment.  He requests additional treatment at this time.    Plan:  I have had a thorough discussion with [name] regarding the treatment options available for his worsened carpal tunnel syndrome, which is causing  him significant  limitations.  I have outlined for [name] the benefits and consequences of the various treatment modalities, including the fact that surgical treatment is the only modality which is reasonably expected to provide long lasting or permanent resolution of his symptoms.  Based upon our current discussion and a reasonable understating of the options available to him, [name] has selected to proceed with surgical Carpal  Tunnel Release.  I have discussed the details of the surgical procedure, the pre, peri and postoperative concerns and the appropriate expectations after surgery with [name] today. He was given the opportunity to ask questions, voiced an understanding of the procedure, and he did wish to proceed with Right Carpal Tunnel Release.       I had an extensive discussion with [name] (and any family members present with him today) regarding the natural history, etiology, and long term consequences of this problem.  We have mutually selected a surgical treatment plan  and, in my opinion, surgical intervention is indicated at this time. I have discussed with him the potential complications, limitations, expectations, alternatives, and risks of Carpal Tunnel Release.  He has had full opportunity to ask his questions. I have answered them all to his satisfaction. I feel that [name] (and any family members present with him today) do understand our discussion today and he has provided informed consent for Right Carpal Tunnel Release.     I have had a thorough discussion with 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  I have outlined for [name] the benefits and consequences of the various treatment modalities, including the fact that surgical treatment is the only modality which is reasonably expected to provide long lasting or permanent resolution of his symptoms.  Based upon our current discussion and a reasonable understating of the options available to him, [name] has selected to proceed with surgical Thumb and Ring Finger Trigger Finger Release.  I have discussed the details of the surgical procedure, the pre, peri and postoperative concerns and the appropriate expectations after surgery with [name] today. He was given the  opportunity to ask questions, voiced an understanding of the procedure, and he did wish to proceed with Left Thumb and Ring Finger Trigger Finger Release (A1 Pulley Release).       I had an extensive discussion with Mr. AZARIEL BANIK (and any family members present with him today) regarding the natural history, etiology, and long term consequences of this problem.  We have mutually selected a surgical treatment plan  and, in my opinion, surgical intervention is indicated at this time. I have discussed with him the potential complications, limitations, expectations, alternatives, and risks of Trigger Finger Release.  He has had full opportunity to ask his questions. I have answered them all to his satisfaction. I feel that Mr. TALIESIN HARTLAGE (and any family members present with him today) do understand our discussion today and he has provided informed consent for Left Thumb and Ring Finger Trigger Finger Release (A1 Pulley Release).     I have also discussed with Mr. KODAH MARET the Jeremiah treatment options available to him for this condition.  We have today selected to proceed with Surgical treatment.  He has voiced and  understanding that there are Jeremiah less  aggressive treatment options which are available in this situation, albeit possibly less efficacious or durable, and he is comfortable with the plan that he has chosen.     I have explained to Mr. DOREN KASPAR that despite successful treatment (surgical decompression or otherwise) of his nerve entrapment, that due to his severe nerve damage, that he is likely to have some permanent residual symptoms that do not improve long term.  I have also explained that maximal recovery of nerve function may take a full year or longer to realize.  He voiced a clear understanding of this.    Mr. ASHTEN PRATS has been given a full verbal list of instructions and precautions related to his present condition.  I have asked him to followup with me in the office at the  prescribed time. He is also specifically requested to call or return to the office sooner if his symptoms change or worsen prior to the next scheduled appointment.

## 2020-05-30 NOTE — Patient Instructions (Signed)
Pre-Operative Instructions    1. The night before your surgery, unless otherwise instructed, do not eat any food, drink any liquids, chew gum or mints after midnight. Abstain from alcohol for 24 hours prior to surgery.     2. You will be contacted by the Hospital the working day prior to your procedure to confirm your arrival time.     3. Patients under 62 years of age must have a parent or legal guardian present to sign their consent and discharge paperwork.     4. On the day of surgery,  you will be seen pre-operatively by an anesthesiologist.     5. If you are having hand surgery, it is recommended that nail polish and acrylic nails be removed prior to surgery if possible.     6. Please bring cases for glasses, contact lenses, hearing aids or dentures. They will likely be removed prior to surgery.     7. Wear casual, loose-fitting and comfortable clothing. Consider that you may have a large dressing to fit under your clothing after surgery.     9. Please do not bring valuables such as jewelry or large sums of cash to the hospital. Remove all body piercings before coming to the hospital. You may not  wear any rings on the hand if you are having surgery on that hand, wrist or elbow.     10. Do not smoke or chew tobacco before your surgery. All [facility] and surgery facilities are smoke-free environments. Smoking is not permitted anywhere on campus.     11. Be sure to follow any additional instructions from your physician.       If the above conditions are not met, your surgery may be cancelled and rescheduled for another day.      Should you develop any change in your health such as fever, cough, sore throat, cold, flu, or infection, or if you have any questions regarding your Pre-admission or surgery, please contact [facility] - Surgery Scheduling at [phone], Monday through Friday, 9 a.m. to 5 p.m.

## 2020-05-31 NOTE — Telephone Encounter (Addendum)
CPT: [id number]  BODY PART: right wrist  STATUS: outpatient  LOCATION: [place]  AUTHORIZATION: NPR    Submitted online with [employer] 05/31/20    Per [employer], NPR

## 2020-06-10 ENCOUNTER — Ambulatory Visit: Admit: 2020-06-10 | Discharge: 2020-06-10 | Payer: PRIVATE HEALTH INSURANCE | Attending: Sports Medicine

## 2020-06-10 DIAGNOSIS — Z01818 Encounter for other preprocedural examination: Secondary | ICD-10-CM

## 2020-06-10 NOTE — Patient Instructions (Addendum)
Decrease the long-acting insulin (lantus/glargine) to 45 units on 6/13 (day before the procedure) and then decrease insulin to 30 units the day of the procedure. Do not take the Janument the day of the procedure.

## 2020-06-10 NOTE — Progress Notes (Addendum)
Subjective:    Chief Complaint: Pre-Operative Exam     Jeremiah Guzman is a 60 y.o. male who presents for a preoperative physical examination.  He is scheduled to have right carpal tunnel release done by Dr. Jannifer Franklin on 06/21/2020.    History of Present Illness:    Emergent procedure No  Active Cardiac Condition No (decompensated HF, Arrhythmia, MI <3 weeks, severe valve disease)  Risk Level of Procedure Low Risk (endoscopy, superficial skin, breast, ambulatory, or cataract, etc.)  Revised Cardiac Risk Index Risk factors: Diabetic treated with insulin  Able to reach >4 METS: Yes    Surgeon's notes reviewed.    60 units lantus in the morning, SSI for mealtime, janumet daily. Was having low blood sugars in the mornings 50-60. Maybe 200 before bed, in the mornings it would go down to 100.     Past Medical History:   Diagnosis Date   ??? Basal cell carcinoma 08/2013    left nose   ??? Cancer of skin    ??? Diabetes type 1, uncontrolled ([place])    ??? Erectile dysfunction    ??? HBP (high blood pressure) 03/27/2008   ??? Hyperlipidemia 03/27/2008   ??? Obesity 03/27/2008        Review of patient's past surgical history indicates:     Past Surgical History:   Procedure Laterality Date   ??? CARPAL TUNNEL RELEASE Left 12/25/2018    LEFT CARPAL TUNNEL RELEASE performed by Delene Loll, MD at [place]   ??? COLONOSCOPY  01-06-2007    neg Dr. Beatrix Shipper   ??? COLONOSCOPY N/A 02/18/2018    COLORECTAL CANCER SCREENING, NOT HIGH RISK performed by Orvan Seen, MD at [place]   ??? SKIN BIOPSY     ??? SKIN CANCER EXCISION Left 08/2013    nose   ??? TONSILLECTOMY                                                     Current Outpatient Medications   Medication Sig Dispense Refill   ??? insulin glargine (LANTUS SOLOSTAR) 100 UNIT/ML injection pen Inject 55 Units into the skin daily 15 pen 1   ??? lisinopril (PRINIVIL;ZESTRIL) 10 MG tablet Take 1 tablet by mouth daily 90 tablet 1   ??? simvastatin (ZOCOR) 40 MG tablet Take 1 tablet by mouth nightly 90 tablet 1   ???  Continuous Blood Gluc Receiver (FREESTYLE LIBRE 2 READER) DEVI Use as directed re: DMI (E10.65) 1 each 0   ??? Continuous Blood Gluc Sensor (FREESTYLE LIBRE 2 SENSOR) MISC Use as directed re: DMI (E10.65) 6 each 1   ??? tamsulosin (FLOMAX) 0.4 MG capsule Take 1 capsule by mouth daily 90 capsule 1   ??? naproxen (NAPROSYN) 500 MG tablet Take 1 tablet by mouth 2 times daily (with meals) 180 tablet 1   ??? sitaGLIPtan-metFORMIN (JANUMET) 50-500 MG per tablet Take 1 tablet by mouth 2 times daily (with meals) 180 tablet 3   ??? sildenafil (VIAGRA) 100 MG tablet Take 1 tablet by mouth as needed for Erectile Dysfunction 30 tablet 3   ??? FREESTYLE LITE strip USE THREE TIMES A DAY AS NEEDED 300 each 3   ??? insulin lispro (HUMALOG KWIKPEN) 100 UNIT/ML pen Inject 10 Units into the skin 3 times daily (before meals) (Patient taking differently: Inject  10-18 Units into the skin daily Sliding scale) 15 pen 3   ??? glucose monitoring kit (FREESTYLE) monitoring kit 1 kit by Does not apply route three times daily 1 kit 0   ??? Lancets MISC Test TID with freestyle meter 300 each 3   ??? Insulin Pen Needle (B-D ULTRAFINE III SHORT PEN) 31G X 8 MM MISC Patient test BID  Dx 250.00 300 each 3   ??? Insulin Syringe-Needle U-100 31G X 5/16" 1 ML MISC 1 each by Does not apply route daily 100 each 5   ??? Multiple Vitamin TABS Take 1 tablet by mouth daily      ??? aspirin 81 MG tablet Take 81 mg by mouth daily.       No current facility-administered medications for this visit.       No Known Allergies    Social History     Tobacco Use   ??? Smoking status: Never Smoker   ??? Smokeless tobacco: Former Systems developer     Types: Scientist, physiological   ??? Vaping Use: Never used   Substance Use Topics   ??? Alcohol use: Yes     Comment: weekends   ??? Drug use: Never        Family History   Problem Relation Age of Onset   ??? Cancer Mother         colon   ??? Colon Cancer Mother    ??? Cancer Father         skin   ??? Asthma Sister    ??? Diabetes Paternal Grandmother    ??? Heart Attack Maternal  Grandfather 40        Review Of Systems  Skin: no abnormal pigmentation, rash, scaling, itching, masses, hair or nail changes  Eyes: negative  Ears/Nose/Throat: negative  Respiratory: negative  Cardiovascular: negative  Gastrointestinal: negative  Genitourinary: negative  Musculoskeletal: negative  Neurologic: negative  Psychiatric: negative  Hematologic/Lymphatic/Immunologic: negative  Endocrine: negative       Objective:      BP 135/81 (Site: Right Upper Arm, Position: Sitting, Cuff Size: Small Adult)    Pulse 98    Ht '5\' 11"'  (1.803 m)    Wt 249 lb 12.8 oz (113.3 kg)    SpO2 99%    BMI 34.84 kg/m??   General appearance - healthy, alert, no distress  Skin - Skin color, texture, turgor normal. No rashes.  Head - Normocephalic. No masses or abnormalities  Eyes - conjunctivae/corneas clear. EOM's intact.  Ears - External ears normal. Canals clear. TM's normal.  Nose/Sinuses - Nares normal. Septum midline. Mucosa normal. No drainage or sinus tenderness.  Oropharynx - Lips, mucosa, and tongue normal. Teeth and gums normal. Oropharynx pink and patent  Neck - Neck supple. No adenopathy.   Lungs - Good diaphragmatic excursion. Lungs clear to ausc BL  Heart - Regular rate and rhythm, with no rub, murmur or gallop noted.  Abdomen - Abdomen soft, non-tender.   Extremities - Extremities normal. No deformities, edema, or skin discoloration  Musculoskeletal - spont ROM normal in all extremities.   Peripheral pulses - radial=4/4, dorsalis pedis=4/4  Neuro - Sensation grossly normal.  No focal weakness    Laboratory findings from 02/19/2020 were reviewed: CBC, renal, A1c (8.4%), LFT's all reviewed.       Assessment and Plan:   Jeremiah Guzman has an RCRI score of 1 putting him/her in: RCI RISK CLASS II (1 risk factor, risk of major cardiac compl. appr.  1.3%) during a low risk procedure. There is no further workup recommended prior to surgery.       Medication Recommendations/adjustments:  Decrease Lantus day prior to surgery to 45  units and decrease it the day of surgery to 30 units. If > 200 morning of surgery may take 45 units. If greater than 300 morning of surgery may take 60 units. Do not take mealtime insulin day of surgery. Do not take Janumet day of surgery - these recommendations were provided to him in writing and discussed verbally with oral read-back.      Diagnosis Orders   1. Pre-operative examination     2. Carpal tunnel syndrome of right wrist     3. Trigger finger, acquired     4. Uncontrolled type 1 diabetes mellitus with hyperglycemia ([place])     5. Primary hypertension         Chanda Busing, MD  Internal Medicine/Sports Medicine    Addendum: Pt is having another procedure done for trigger finger releases on 07/19/20 with Dr. Jannifer Franklin as well. He may follow the same plan as for the prior procedure as long as this worked well for him.

## 2020-06-13 NOTE — Telephone Encounter (Signed)
Called pt back, spoke to wife - informed her per anesthesia, he can have clear liquids such as jell-o up until 0800.  NO solid foods after 0600.  If pt wakes up prior to 0600 to have breakfast, please write down what time pt eats as well as everything pt consumes (ex: if pt has toast with jelly or butter, include there was jelly and or butter) and take w/ him for sx so he has this information to provide when asked last time pt ate and what he ate.  Pt's wife verbalized understanding.

## 2020-06-13 NOTE — Telephone Encounter (Signed)
Pt worried about going NPO until 1405 being a type 2 diabetic, wondering about changing sx times to earlier time.  Informed pt I would reach out to Anesthesia to see the absolute least amt of time pt can be NPO prior to [place] changing sx times to see if this will be sufficient.      Spoke to Dr. Janne Napoleon w/ anesthesia - nothing solid after 0600, can have clear liquids and jell-o 4-6 hrs prior to sx, prefer pt to be NPO for at least 6 hrs.

## 2020-06-14 NOTE — Telephone Encounter (Signed)
Patient left a voicemail asking if the 16th is available

## 2020-06-14 NOTE — Telephone Encounter (Signed)
Spoke to pt, informed him there is nothing available on the 16th.  Pt stated he was wondering if there was any earlier time, then stated he spoke to his wife from my conversation yesterday and everything is fine at this time.  No changes made.

## 2020-06-14 NOTE — Progress Notes (Signed)
C-diff Questionnaire:     * Admitted with diarrhea?                         []  YES    [x]   NO     *Prior history of C-Diff. In last 3 months? []  YES    [x]   NO     *Antibiotic use in the past 6-8 weeks?      [x]   NO    []   YES      If yes, which: REASON_________________     *Prior hospitalization or nursing home in the last month? []   YES    [x]   NO     SAFETY FIRST..call before you fall    [facility] PRE-OPERATIVE INSTRUCTIONS    Arrival time___1235_________        Surgery time__1405__________    Do not eat or drink anything after 12:00 midnight prior to your surgery.Follow your MD/Surgeons pre-procedure instructions regarding your medications  This includes water chewing gum, mints and ice chips- the Day of Surgery.  You may brush your teeth and gargle the morning of your surgery, but do not swallow the water     Please see your family doctor/pediatrician for a history and physical and/or questions concerning medications.   Bring any test results/reports from your physicians office.   If you are under the care of a heart doctor or specialist doctor, please be aware that you may be asked to them for clearance    You may be asked to stop blood thinners such as Coumadin, Plavix, Fragmin, Lovenox, etc., or any anti-inflammatories such as:  Aspirin, Ibuprofen, Advil, Naproxen prior to your surgery.    We also ask that you stop any OTC medications such as fish oil, vitamin E, glucosamine, garlic, Multivitamins, COQ 10, etc. MAY TAKE TYLENOL    We ask that you do not smoke 24 hours prior to surgery  We ask that you do not  drink any alcoholic beverages 24 hours prior to surgery     You must make arrangements for a responsible adult to take you home after your surgery.    For your safety you will not be allowed to leave alone or drive yourself home.  Your surgery will be cancelled if you do not have a ride home.     Also for your safety, it is strongly suggested that someone stay with you the first 24 hours after  your surgery.     A parent or legal guardian must accompany a child scheduled for surgery and plan to stay at the hospital until the child is discharged.    Please do not bring other children with you.    For your comfort, please wear simple loose fitting clothing to the hospital.  Please do not bring valuables. Do not wear any make-up or nail polish on your fingers or toes.  For your safety, please do not wear any jewelry or body piercing's on the day of surgery. All jewelry must be removed.     If you have dentures, they will be removed before going to operating room.    For your convenience, we will provide you with a container.    If you wear contact lenses or glasses, they will be removed, please bring a case for them.     If you have a living will and a durable power of attorney for healthcare, please bring in a copy.  As part of our patient safety program to minimize surgical site infections, we ask you to do the following:    ?? Please notify your surgeon if you develop any illness between         now and the day of your surgery.    ?? This includes a cough, cold, fever, sore throat, nausea,         or vomiting, and diarrhea, etc.  ??  Please notify your surgeon if you experience dizziness, shortness         of breath or blurred vision between now and the time of your surgery.      Do not shave your operative site 96 hours prior to surgery.   For face and neck surgery, men may use an electric razor 48 hours   prior to surgery.    You may shower the night before surgery or the morning of   your surgery with an antibacterial soap.    You will need to bring a photo ID and insurance card     If you use a C-pap or Bi-pap machine, please bring your machine with you to the hospital     Our goal is to provide you with excellent care, therefore, visitors will be limited to so that we may focus on providing this care for you.          Please contact your surgeon office, if you have any further questions.                  [facility] phone number:  [place] [facility] fax number:  [phone]    Please note these are generalized instructions for all surgical cases, you may be provided with more specific instructions according to your surgery.

## 2020-06-15 NOTE — Telephone Encounter (Signed)
Spoke to pt, switched sx date and time from 6/14 to 6/16 @ 0820, arrival time of 0650.

## 2020-06-15 NOTE — Telephone Encounter (Signed)
Spoke to pt, informed him I just had a cancellation on 06/23/20 @ 0820, arrival time would be 0650.  Called to see if pt would like to switch to this slot d/t concerns w/ not eating all day d/t DM2.  Pt stated he believes this time will work - is not home at this time and needs to check his schedule and speak to his wife.  Will call back in ~2 hrs.  Informed pt I will hold this spot for him.

## 2020-06-21 ENCOUNTER — Encounter: Attending: Orthopaedic Surgery

## 2020-06-23 ENCOUNTER — Inpatient Hospital Stay: Payer: PRIVATE HEALTH INSURANCE

## 2020-06-23 ENCOUNTER — Encounter: Admit: 2020-06-23 | Payer: PRIVATE HEALTH INSURANCE | Attending: Orthopaedic Surgery

## 2020-06-23 LAB — POCT GLUCOSE
POC Glucose: 172 mg/dl — ABNORMAL HIGH (ref 70–99)
POC Glucose: 197 mg/dl — ABNORMAL HIGH (ref 70–99)

## 2020-06-23 MED ORDER — LIDOCAINE HCL (PF) 2 % IJ SOLN
2 | INTRAMUSCULAR | Status: AC
Start: 2020-06-23 — End: 2020-06-23

## 2020-06-23 MED ORDER — ONDANSETRON HCL 4 MG/2ML IJ SOLN
4 MG/2ML | Freq: Once | INTRAMUSCULAR | Status: DC | PRN
Start: 2020-06-23 — End: 2020-06-23

## 2020-06-23 MED ORDER — PROPOFOL 200 MG/20ML IV EMUL
200 | INTRAVENOUS | Status: DC | PRN
Start: 2020-06-23 — End: 2020-06-23
  Administered 2020-06-23: 12:00:00 180 via INTRAVENOUS
  Administered 2020-06-23: 12:00:00 150 via INTRAVENOUS

## 2020-06-23 MED ORDER — SODIUM CHLORIDE 0.9 % IV SOLN
0.9 | INTRAVENOUS | Status: DC
Start: 2020-06-23 — End: 2020-06-23
  Administered 2020-06-23: 12:00:00 via INTRAVENOUS

## 2020-06-23 MED ORDER — HYDROMORPHONE 0.5MG/0.5ML IJ SOLN
1 MG/ML | Status: DC | PRN
Start: 2020-06-23 — End: 2020-06-23

## 2020-06-23 MED ORDER — NORMAL SALINE FLUSH 0.9 % IV SOLN
0.9 | INTRAVENOUS | Status: DC | PRN
Start: 2020-06-23 — End: 2020-06-23

## 2020-06-23 MED ORDER — SODIUM CHLORIDE 0.9 % IV SOLN
0.9 % | INTRAVENOUS | Status: DC | PRN
Start: 2020-06-23 — End: 2020-06-23

## 2020-06-23 MED ORDER — PROPOFOL 200 MG/20ML IV EMUL
200 | INTRAVENOUS | Status: AC
Start: 2020-06-23 — End: 2020-06-23

## 2020-06-23 MED ORDER — FENTANYL CITRATE (PF) 100 MCG/2ML IJ SOLN
100 MCG/2ML | INTRAMUSCULAR | Status: DC | PRN
Start: 2020-06-23 — End: 2020-06-23

## 2020-06-23 MED ORDER — BUPIVACAINE HCL 0.5 % IJ SOLN (MIXTURES ONLY)
0.5 % | Freq: Once | INTRAMUSCULAR | Status: AC | PRN
Start: 2020-06-23 — End: 2020-06-23
  Administered 2020-06-23: 12:00:00 2

## 2020-06-23 MED ORDER — SODIUM CHLORIDE 0.9 % IR SOLN
0.9 % | Status: AC | PRN
Start: 2020-06-23 — End: 2020-06-23
  Administered 2020-06-23: 12:00:00 250

## 2020-06-23 MED ORDER — LIDOCAINE HCL (PF) 2 % IJ SOLN
2 % | INTRAMUSCULAR | Status: DC | PRN
Start: 2020-06-23 — End: 2020-06-23
  Administered 2020-06-23: 12:00:00 60 via INTRAVENOUS

## 2020-06-23 MED ORDER — NORMAL SALINE FLUSH 0.9 % IV SOLN
0.9 | Freq: Two times a day (BID) | INTRAVENOUS | Status: DC
Start: 2020-06-23 — End: 2020-06-23

## 2020-06-23 MED ORDER — OXYCODONE HCL 5 MG PO TABS
5 MG | Freq: Once | ORAL | Status: DC | PRN
Start: 2020-06-23 — End: 2020-06-23

## 2020-06-23 MED ORDER — NORMAL SALINE FLUSH 0.9 % IV SOLN
0.9 % | Freq: Two times a day (BID) | INTRAVENOUS | Status: DC
Start: 2020-06-23 — End: 2020-06-23

## 2020-06-23 MED FILL — XYLOCAINE-MPF 2 % IJ SOLN: 2 % | INTRAMUSCULAR | Qty: 5

## 2020-06-23 MED FILL — DIPRIVAN 200 MG/20ML IV EMUL: 200 MG/20ML | INTRAVENOUS | Qty: 40

## 2020-06-23 NOTE — Anesthesia Pre-Procedure Evaluation (Signed)
[facility] Department of Anesthesiology  Pre-Anesthesia Evaluation/Consultation       Name:  Jeremiah Guzman  DOB: 07/15/1960  Age:  60 y.o.                                           MRN:  [id number]  Date: 06/23/2020           Surgeon: Surgeon(s):  Delene Loll, MD    Procedure: Procedure(s):  RIGHT CARPAL TUNNEL RELEASE     No Known Allergies  Patient Active Problem List   Diagnosis   ??? Obesity   ??? Hyperlipidemia   ??? HTN (hypertension)   ??? Cancer of skin   ??? Jaw swelling   ??? Jaw pain   ??? Type 1 diabetes mellitus, uncontrolled ([place])   ??? Cervical lymphadenopathy   ??? Diabetes type 1, uncontrolled ([facility])     Past Medical History:   Diagnosis Date   ??? Basal cell carcinoma 08/2013    left nose   ??? Diabetes type 1, uncontrolled ([place])    ??? Erectile dysfunction    ??? HBP (high blood pressure) 03/27/2008   ??? Hyperlipidemia 03/27/2008   ??? Obesity 03/27/2008     Past Surgical History:   Procedure Laterality Date   ??? CARPAL TUNNEL RELEASE Left 12/25/2018    LEFT CARPAL TUNNEL RELEASE performed by Delene Loll, [place] at [place]   ??? COLONOSCOPY  01-06-2007    neg Dr. Beatrix Shipper   ??? COLONOSCOPY N/A 02/18/2018    COLORECTAL CANCER SCREENING, NOT HIGH RISK performed by Orvan Seen, MD at [place]   ??? SKIN BIOPSY     ??? SKIN CANCER EXCISION Left 08/2013    nose   ??? TONSILLECTOMY       Social History     Tobacco Use   ??? Smoking status: Never Smoker   ??? Smokeless tobacco: Former Systems developer     Types: Scientist, physiological   ??? Vaping Use: Never used   Substance Use Topics   ??? Alcohol use: Yes     Comment: weekends   ??? Drug use: Never     Medications  No current facility-administered medications on file prior to encounter.     Current Outpatient Medications on File Prior to Encounter   Medication Sig Dispense Refill   ??? insulin glargine (LANTUS SOLOSTAR) 100 UNIT/ML injection pen Inject 55 Units into the skin daily 15 pen 1   ??? lisinopril (PRINIVIL;ZESTRIL) 10 MG tablet Take 1 tablet by mouth daily 90 tablet 1   ??? simvastatin (ZOCOR) 40 MG  tablet Take 1 tablet by mouth nightly (Patient taking differently: Take 40 mg by mouth daily ) 90 tablet 1   ??? Continuous Blood Gluc Receiver (FREESTYLE LIBRE 2 READER) DEVI Use as directed re: DMI (E10.65) 1 each 0   ??? Continuous Blood Gluc Sensor (FREESTYLE LIBRE 2 SENSOR) MISC Use as directed re: DMI (E10.65) 6 each 1   ??? tamsulosin (FLOMAX) 0.4 MG capsule Take 1 capsule by mouth daily 90 capsule 1   ??? naproxen (NAPROSYN) 500 MG tablet Take 1 tablet by mouth 2 times daily (with meals) (Patient taking differently: Take 500 mg by mouth 2 times daily as needed ) 180 tablet 1   ??? sitaGLIPtan-metFORMIN (JANUMET) 50-500 MG per tablet Take 1 tablet by mouth 2 times daily (with  meals) 180 tablet 3   ??? sildenafil (VIAGRA) 100 MG tablet Take 1 tablet by mouth as needed for Erectile Dysfunction 30 tablet 3   ??? FREESTYLE LITE strip USE THREE TIMES A DAY AS NEEDED 300 each 3   ??? insulin lispro (HUMALOG KWIKPEN) 100 UNIT/ML pen Inject 10 Units into the skin 3 times daily (before meals) (Patient taking differently: Inject into the skin daily Sliding scale:  If 200 or greater takes 12 units                          If 250 or greater takes 15 units                          If 300 or greater takes 18 units) 15 pen 3   ??? glucose monitoring kit (FREESTYLE) monitoring kit 1 kit by Does not apply route three times daily 1 kit 0   ??? Lancets MISC Test TID with freestyle meter 300 each 3   ??? Insulin Pen Needle (B-D ULTRAFINE III SHORT PEN) 31G X 8 MM MISC Patient test BID  Dx 250.00 300 each 3   ??? Insulin Syringe-Needle U-100 31G X 5/16" 1 ML MISC 1 each by Does not apply route daily 100 each 5   ??? Multiple Vitamin TABS Take 1 tablet by mouth daily      ??? aspirin 81 MG tablet Take 81 mg by mouth daily.       Current Facility-Administered Medications   Medication Dose Route Frequency Provider Last Rate Last Admin   ??? 0.9 % sodium chloride infusion   IntraVENous Continuous Sonia Baller, MD 125 mL/hr at 06/23/20 0738 New Bag at 06/23/20  0738   ??? sodium chloride flush 0.9 % injection 5-40 mL  5-40 mL IntraVENous 2 times per day Sonia Baller, MD       ??? sodium chloride flush 0.9 % injection 5-40 mL  5-40 mL IntraVENous PRN Sonia Baller, MD       ??? 0.9 % sodium chloride infusion   IntraVENous PRN Sonia Baller, MD         Vital Signs (Current)   Vitals:    06/23/20 0731   BP: (!) 156/98   Pulse: 88   Resp: 18   Temp: 96.8 ??F (36 ??C)   SpO2: 97%     Vital Signs Statistics (for past 48 hrs)     Temp  Avg: 96.8 ??F (36 ??C)  Min: 96.8 ??F (36 ??C)   Min taken time: 06/23/20 0731  Max: 96.8 ??F (36 ??C)   Max taken time: 06/23/20 0731  Pulse  Avg: 88  Min: 88   Min taken time: 06/23/20 0731  Max: 82   Max taken time: 06/23/20 0731  Resp  Avg: 18  Min: 39   Min taken time: 06/23/20 0731  Max: 34   Max taken time: 06/23/20 0731  BP  Min: 156/98   Min taken time: 06/23/20 0731  Max: 156/98   Max taken time: 06/23/20 0731  SpO2  Avg: 97 %  Min: 97 %   Min taken time: 06/23/20 0731  Max: 97 %   Max taken time: 06/23/20 0731    BP Readings from Last 3 Encounters:   06/23/20 (!) 156/98   06/10/20 135/81   02/17/20 130/74     BMI  Body mass index is 34.17 kg/m??.  Estimated body mass index is 34.17  kg/m?? as calculated from the following:    Height as of this encounter: _0  (1.803 m).    Weight as of this encounter: 245 lb (111.1 kg).    CBC   Lab Results   Component Value Date    WBC 7.3 11/03/2018    RBC 5.31 11/03/2018    HGB 16.9 11/03/2018    HCT 51.6 11/03/2018    MCV 97.1 11/03/2018    RDW 12.8 11/03/2018    PLT 217 11/03/2018     CMP    Lab Results   Component Value Date    NA 140 02/19/2020    K 4.4 02/19/2020    CL 101 02/19/2020    CO2 25 02/19/2020    BUN 10 02/19/2020    CREATININE 0.7 02/19/2020    GFRAA >60 02/19/2020    GFRAA >60 04/10/2011    AGRATIO 1.5 11/03/2018    LABGLOM >60 02/19/2020    GLUCOSE 171 02/19/2020    PROT 6.9 11/03/2018    PROT 6.5 06/29/2010    CALCIUM 9.3 02/19/2020    BILITOT 0.7 11/03/2018    ALKPHOS 67 11/03/2018    AST 25  02/19/2020    ALT 36 02/19/2020     BMP    Lab Results   Component Value Date    NA 140 02/19/2020    K 4.4 02/19/2020    CL 101 02/19/2020    CO2 25 02/19/2020    BUN 10 02/19/2020    CREATININE 0.7 02/19/2020    CALCIUM 9.3 02/19/2020    GFRAA >60 02/19/2020    GFRAA >60 04/10/2011    LABGLOM >60 02/19/2020    GLUCOSE 171 02/19/2020     POCGlucose  No results for input(s): GLUCOSE in the last 72 hours.   Coags  No results found for: PROTIME, INR, APTT  HCG (If Applicable) No results found for: PREGTESTUR, PREGSERUM, HCG, HCGQUANT   ABGs No results found for: PHART, PO2ART, PCO2ART, HCO3ART, BEART, O2SATART   Type & Screen (If Applicable)  No results found for: LABABO, LABRH                         BMI: Wt Readings from Last 3 Encounters:       NPO Status:   Date of last liquid consumption: 06/22/20   Time of last liquid consumption: 2100   Date of last solid food consumption: 06/22/20      Time of last solid consumption: 2100       Anesthesia Evaluation  Patient summary reviewed no history of anesthetic complications:   Airway: Mallampati: III  TM distance: >3 FB   Neck ROM: full  Mouth opening: > = 3 FB   Dental: normal exam         Pulmonary:Negative Pulmonary ROS and normal exam                               Cardiovascular:  Exercise tolerance: good (>4 METS),   (+) hypertension:,         Rhythm: regular  Rate: normal           Beta Blocker:  Not on Beta Blocker         Neuro/Psych:   Negative Neuro/Psych ROS              GI/Hepatic/Renal: Neg GI/Hepatic/Renal ROS       (-) GERD  Endo/Other:    (+) DiabetesType II DM, using insulin, .                 Abdominal:   (+) obese,           Vascular: negative vascular ROS.         Other Findings:           Anesthesia Plan      MAC     ASA 3       Induction: intravenous.    MIPS: Postoperative opioids intended and Prophylactic antiemetics administered.  Anesthetic plan and risks discussed with patient.      Plan discussed with CRNA.                This  pre-anesthesia assessment may be used as a history and physical.    DOS STAFF ADDENDUM:    Pt seen and examined, chart reviewed (including anesthesia, drug and allergy history).  No interval changes to history and physical examination.  Anesthetic plan, risks, benefits, alternatives, and personnel involved discussed with patient. Questions and concerns addressed.  Patient(family) verbalized an understanding and agrees to proceed.      Sonia Baller, MD  June 23, 2020  7:45 AM

## 2020-06-23 NOTE — Anesthesia Post-Procedure Evaluation (Signed)
[facility] Department of Anesthesiology  Post-Anesthesia Note       Name:  Jeremiah Guzman                                  Age:  60 y.o.  MRN:  [id number]     Last Vitals & Oxygen Saturation: BP (!) 142/87    Pulse 75    Temp 98.2 ??F (36.8 ??C) (Temporal)    Resp 16    Ht 5\' 11"  (1.803 m)    Wt 245 lb (111.1 kg)    SpO2 98%    BMI 34.17 kg/m??   Patient Vitals for the past 4 hrs:   BP Temp Temp src Pulse Resp SpO2 Weight   06/23/20 0840 (!) 142/87 98.2 ??F (36.8 ??C) Temporal 75 16 98 % --   06/23/20 0835 (!) 157/84 -- -- 80 12 97 % --   06/23/20 0830 (!) 161/85 -- -- 78 18 98 % --   06/23/20 0825 (!) 154/82 -- -- 81 18 100 % --   06/23/20 0819 (!) 147/75 98.9 ??F (37.2 ??C) Temporal 84 15 100 % --   06/23/20 0731 (!) 156/98 96.8 ??F (36 ??C) Temporal 88 18 97 % --   06/23/20 0718 -- -- -- -- -- -- 245 lb (111.1 kg)       Level of consciousness:  Awake, alert    Respiratory: Respirations easy, no distress. Stable.    Cardiovascular: Hemodynamically stable.    Hydration: Adequate.    PONV: Adequately managed.    Post-op pain: Adequately controlled.    Post-op assessment: Tolerated anesthetic well without complication.    Complications:  None.    06/25/20, MD  June 23, 2020   9:45 AM

## 2020-06-23 NOTE — Discharge Instructions (Signed)
Post-Operative Instructions    Carpal Tunnel Release:    ? Keep hand elevated with fingers above eye-level to control swelling.  ? Keep hand and bandage clean and dry.  ? Do not change or unwrap bandage.  Please leave bandage in place until your follow-up appointment.  ? Maintain finger motion by fully straightening and fully bending fingers and thumb at least once an hour (while awake).  This may cause some discomfort, but will not damage surgery.  ? You may use your operated hand for lightweight tasks (e.g. writing, eating, dressing, etc.).  NO LIFTING, CARRYING OR HEAVY USE.    ? Most Patients do not have "Serious Pain" after this procedure and thus most patients do not require prescription pain medication.  ? You may take over the counter medication (Tylenol, Advil, Aleve, etc.) as needed.  If you are unable to tolerate the discomfort after your surgery and the OTC medications do not provide some relief, you may contact our office to discuss other options..    ? Please call the office at [phone]  in 24 - 48 hours to schedule a follow up appointment for approximately 7 - 10 days after surgery.  ? Please call the office at [phone]  if you have any questions or problems.           Emersen Carroll B. Kiarra Kidd, MD

## 2020-06-23 NOTE — Op Note (Signed)
OPERATIVE REPORT          Patient:  Jeremiah Guzman    Date of Birth:  [birth year]  Date of Service:  06/23/2020   Location:  [facility] - Main OR    Preoperative Diagnosis:    Right carpal tunnel syndrome    Postoperative Diagnosis:    Same    Procedure:    Right carpal tunnel release      Surgeon:    Tasia Catchings B. Anne Hahn, MD    Surgical Assistant:    Gregery Na, PA-C    Anesthesia:   Local with Sedation    Blood Loss:   Minimal    Complications:  None    Tourniquet Time: 3 minutes     Indications:  [name]  is a 60 y.o. year-old male with Right carpal tunnel syndrome. I have discussed preoperatively with him  the complications, limitations, expectations, alternatives and risks of surgical care, which he has understood.  All of his questions have been fully answered, and he has provided written informed consent to proceed.    Procedure:   After written consent was obtained and the proper operative site was identified and marked, [name] was brought to the operating room, placed in the supine position on the operating room table with the Right arm extended upon a hand table.  Under an appropriate level of sedation, local anesthetic (1% Lidocaine and 1/2% Marcaine both without Epinephrine) was instilled in the planned surgical field. His Right upper extremity was prepped and draped in the usual sterile fashion.     After Esmarch exsanguination, the pneumotourniquet was inflated to 250 mm of mercury.   A 2 cm longitudinal incision was fashioned at the base of the palm, paralleling the longitudinal thenar crease. Dissection was carried carefully through the subcutaneous tissue identifying and protecting the neurovascular structures. The palmar fascia was incised longitudinally, exposing the transverse carpal ligament. The transverse carpal ligament was incised from its proximal to distal most extent, under direct visualization. The terminal 2 cm of antebrachial fascia was similarly incised under  direct visualization. The contents of the carpal tunnel were inspected and found to be free of mass, lesion or other abnormality. Digital palpation revealed no further constriction about the median nerve.      The wound was irrigated copiously with sterile saline for irrigation and the pneumotourniquet was deflated after a period of 3 minutes elevation.  The fingers were immediately pink & well perfused.  Hemostasis was easily obtained with direct pressure and electrocautery and the wound was closed with interrupted sutures.  The wound was dressed with adaptic, dry sterile dressings and a bulky soft hand & wrist dressing was applied. [name]  was awakened from light sedation, having tolerated the procedure without apparent complication.  He  was returned to the recovery room in stable condition.     At the conclusion of the procedure all needle, instrument, and sponge counts were correct.               [name], MD   06/23/2020, 8:18 AM

## 2020-06-23 NOTE — H&P (Signed)
Pre-operative Update of H&P:    I  have seen & examined [name] related solely to his hand and upper extremity conditions, prior to the scheduled procedure on the date of his surgery.  The indications for the planned surgical procedure & and his upper-extremity condition are unchanged.

## 2020-06-23 NOTE — Progress Notes (Signed)
Transfers to chair with RUE elevated fingers above eyes resting on pillows.  Denies pain.  RUE fingers pink with cap refill under 3 seconds.  Spouse at side.  Sips on diet soda and snacks

## 2020-06-27 ENCOUNTER — Ambulatory Visit: Admit: 2020-06-27 | Discharge: 2020-06-27 | Payer: PRIVATE HEALTH INSURANCE

## 2020-06-27 DIAGNOSIS — G5601 Carpal tunnel syndrome, right upper limb: Secondary | ICD-10-CM

## 2020-06-27 NOTE — Patient Instructions (Signed)
Postoperative Instructions After Carpal Tunnel Release    Dr. Tasia Catchings B. [facility]        1. After bandages are removed one week from surgery, you may chose to wear a small bandage over the incision if you wish, though you do not need to.  2. Keep incision dry until sutures have fully dissolved  or it has been 10- 12 days since your surgery. Thereafter, you may wash with mild soap and water and shower normally.    3. Work hard on motion of the fingers and wrist, straightening each finger fully and bending each finger fully, bending wrist forward and bending wrist backwards. Do not be concerned if you experience discomfort.  This will not damage the surgery.  4. You may begin using the hand as it feels comfortable beginning 10 - 12 days from the day of surgery. You may not feel entirely comfortable gripping or lifting heavy objects for several weeks.  5. You may expect to see some skin ???peel??? off around the incision.  You may be left with a small area of ???pink baby skin???. This is quite normal.  6. Once your stiches have fully disappeared & skin appears normal, you should begin gently massaging the incision with Vitamin E (may use Vitamin E lotion or contents of Vitamin E capsule).      Thank you for choosing [facility] for your Hand and Upper Extremity needs.  If we can be of any further assistance to you, please do not hesitate to contact us.    Office Phone Number:  [phone]  or  [phone]

## 2020-06-27 NOTE — Progress Notes (Signed)
Mr. KHING BELCHER returns today in follow-up of his recent right Carpal Tunnel Release done approximately 1 week ago.  He has done well noting no discomfort and no other reported complications.    He notes pre-operative symptoms to be significantly improved at this time.    Physical Exam:  Bandage intact and well cared for  Skin incision is healing well, without erythema, drainage or sign of infection.  Digital range of motion is Full.  Wrist range of motion is Full.  Sensation is improved from preoperatvely  Vascular examination reveals normal, good capillary refill and good color.  Swelling is minimal.  Sensory and Motor Median Nerve function is intact.    Impression:  Mr. ROHAIL KLEES is doing well after recent right Carpal Tunnel Release.    Plan:  Mr. YERAY TOMAS is instructed in work on Active & Passive range of motion of the digits, wrist, & elbow.  These modalities were specifically demonstrated to him today.  We discussed the appropriateness of gradual resumption of use of the operated hand and the return to normal use as comfort allows.  He is given instructions regarding management of the fresh surgical incision and progressive use of desensitization and tissue massage techniques.  We discussed the appropriate expectations and timeline for symptom improvement.    He is provided a written patient instruction sheet titled: Postoperative Instructions After Carpal Tunnel Release.    I have asked Mr. JSIAH MENTA to follow-up with me or contact me by telephone over the next 2-4 weeks if his symptoms have not fully resolved or if he has not regained full & painless return of function.      He is also specifically instructed to return to the office or call for an appointment sooner if his symptoms are changing or worsening prior to that time.

## 2020-06-28 NOTE — Telephone Encounter (Signed)
CPT: 26055  BODY PART: left fingers  STATUS: outpatient  LOCATION: [place]  AUTHORIZATION: NPR    Per [employer]

## 2020-07-14 NOTE — Telephone Encounter (Signed)
Spoke with Lyla Son for [employer].T.  Patient needs to call Dr. Marguerita Merles to see if he will addend his h&p for the left thumb.      Called and left a vm for patient letting him know that he needs to call Dr. Marguerita Merles office to see if he will addend note.  If he doesn't surgery will be cancelled and her will have to get another h&p done.

## 2020-07-14 NOTE — Progress Notes (Addendum)
FOLLOW UP  Talked to Carney Bern at Dr.Willis office for an addendum to H&P from 06-10-20 for Surgery (left thumb and ring finger trigger finger release on 07/19/20). [facility] office will call back when addendum is made     UPDATE 07/15/20: ADDENDUM TO PRE-OP H&P IN EPIC

## 2020-07-14 NOTE — Telephone Encounter (Signed)
Carrie from [facility] called in today re: this patient.   Requested to speak to Dr.Willis' surgery scheduler, covering for [facility].     This patient had RT Thumb surgery 06/23/20. His H&P had been done on 06/10/20.  He is now having his Lt Thumb surgery on 07/19/20.   The H&P is too old for the July surgery. Lyla Son indicated that Dr.Willis had said an addendum could be done to the 06/10/20 H&P.    She is calling in to check on this. The addendum still needs to be done.     Lyla Son can be reached at [phone]

## 2020-07-14 NOTE — Progress Notes (Signed)
* Admitted with diarrhea?                         []  YES    [x]   NO     *Prior history of C-Diff. In last 3 months? []  YES    [x]   NO     *Antibiotic use in the past 6-8 weeks?      [x]   NO    []   YES      If yes, which: REASON_________________     *Prior hospitalization or nursing home in the last month? []   YES    [x]   NO     SAFETY FIRST..call before you fall    [facility] PRE-OPERATIVE INSTRUCTIONS  Arrival time_1130___________        Surgery time_1300___________    Do not eat or drink anything after 12:00 midnight prior to your surgery.  This includes water chewing gum, mints and ice chips- the Day of Surgery.  You may brush your teeth and gargle the morning of your surgery, but do not swallow the water     Please see your family doctor/pediatrician for a history and physical and/or questions concerning medications.   Bring any test results/reports from your physicians office.   If you are under the care of a heart doctor or specialist doctor, please be aware that you may be asked to them for clearance    You may be asked to stop blood thinners such as Coumadin, Plavix, Fragmin, Lovenox, etc., or any anti-inflammatories such as:  Aspirin, Ibuprofen, Advil, Naproxen prior to your surgery.    We also ask that you stop any OTC medications such as fish oil, vitamin E, glucosamine, garlic, Multivitamins, COQ 10, etc.    We ask that you do not smoke 24 hours prior to surgery  We ask that you do not  drink any alcoholic beverages 24 hours prior to surgery     You must make arrangements for a responsible adult to take you home after your surgery.    For your safety you will not be allowed to leave alone or drive yourself home.  Your surgery will be cancelled if you do not have a ride home.     Also for your safety, it is strongly suggested that someone stay with you the first 24 hours after your surgery.     A parent or legal guardian must accompany a child scheduled for surgery and plan to stay at the  hospital until the child is discharged.    Please do not bring other children with you.    For your comfort, please wear simple loose fitting clothing to the hospital.  Please do not bring valuables. Do not wear any make-up or nail polish on your fingers or toes.  For your safety, please do not wear any jewelry or body piercing's on the day of surgery. All jewelry must be removed.     If you have dentures, they will be removed before going to operating room.    For your convenience, we will provide you with a container.    If you wear contact lenses or glasses, they will be removed, please bring a case for them.     If you have a living will and a durable power of attorney for healthcare, please bring in a copy.     As part of our patient safety program to minimize surgical site infections, we ask you to do the  following:    ?? Please notify your surgeon if you develop any illness between         now and the day of your surgery.    ?? This includes a cough, cold, fever, sore throat, nausea,         or vomiting, and diarrhea, etc.  ??  Please notify your surgeon if you experience dizziness, shortness         of breath or blurred vision between now and the time of your surgery.      Do not shave your operative site 96 hours prior to surgery.   For face and neck surgery, men may use an electric razor 48 hours   prior to surgery.    You may shower the night before surgery or the morning of   your surgery with an antibacterial soap.    You will need to bring a photo ID and insurance card     If you use a C-pap or Bi-pap machine, please bring your machine with you to the hospital     Our goal is to provide you with excellent care, therefore, visitors will be limited to so that we may focus on providing this care for you.          Please contact your surgeon office, if you have any further questions.                 [facility] phone number:  [facility] fax number:  [phone]    Please note these are  generalized instructions for all surgical cases, you may be provided with more specific instructions according to your surgery.

## 2020-07-15 NOTE — Telephone Encounter (Signed)
Other PATIENT IS CALLING AND IS REQUESTING THE OFFICE CONTACT DR WILLIS TEAM  REGARDING PRE OP PHYSICAL FOR HIS SURGERY 7/12. PATIENT WOULD LIKE A CALL BACK WHEN THIS HAS BEEN COMPLETED. PH [phone]

## 2020-07-19 ENCOUNTER — Inpatient Hospital Stay: Payer: PRIVATE HEALTH INSURANCE

## 2020-07-19 ENCOUNTER — Encounter: Admit: 2020-07-19 | Payer: PRIVATE HEALTH INSURANCE | Attending: Orthopaedic Surgery

## 2020-07-19 LAB — POCT GLUCOSE
POC Glucose: 224 mg/dl — ABNORMAL HIGH (ref 70–99)
POC Glucose: 192 mg/dl — ABNORMAL HIGH (ref 70–99)

## 2020-07-19 MED ORDER — PROPOFOL 200 MG/20ML IV EMUL
200 | INTRAVENOUS | Status: DC | PRN
Start: 2020-07-19 — End: 2020-07-19
  Administered 2020-07-19: 13:00:00 150 via INTRAVENOUS

## 2020-07-19 MED ORDER — ONDANSETRON HCL 4 MG/2ML IJ SOLN
4 MG/2ML | Freq: Once | INTRAMUSCULAR | Status: DC | PRN
Start: 2020-07-19 — End: 2020-07-19

## 2020-07-19 MED ORDER — FENTANYL CITRATE (PF) 100 MCG/2ML IJ SOLN
100 MCG/2ML | INTRAMUSCULAR | Status: DC | PRN
Start: 2020-07-19 — End: 2020-07-19

## 2020-07-19 MED ORDER — HALOPERIDOL LACTATE 5 MG/ML IJ SOLN
5 MG/ML | Freq: Once | INTRAMUSCULAR | Status: DC | PRN
Start: 2020-07-19 — End: 2020-07-19

## 2020-07-19 MED ORDER — LORAZEPAM 2 MG/ML IJ SOLN
2 MG/ML | Freq: Once | INTRAMUSCULAR | Status: DC | PRN
Start: 2020-07-19 — End: 2020-07-19

## 2020-07-19 MED ORDER — SODIUM CHLORIDE 0.9 % IV SOLN
0.9 % | INTRAVENOUS | Status: DC | PRN
Start: 2020-07-19 — End: 2020-07-19

## 2020-07-19 MED ORDER — NORMAL SALINE FLUSH 0.9 % IV SOLN
0.9 | Freq: Two times a day (BID) | INTRAVENOUS | Status: DC
Start: 2020-07-19 — End: 2020-07-19

## 2020-07-19 MED ORDER — SODIUM CHLORIDE 0.9 % IV SOLN
0.9 % | INTRAVENOUS | Status: DC | PRN
Start: 2020-07-19 — End: 2020-07-19
  Administered 2020-07-19: 13:00:00 via INTRAVENOUS

## 2020-07-19 MED ORDER — NORMAL SALINE FLUSH 0.9 % IV SOLN
0.9 % | INTRAVENOUS | Status: DC | PRN
Start: 2020-07-19 — End: 2020-07-19

## 2020-07-19 MED ORDER — SODIUM CHLORIDE 0.9 % IR SOLN
0.9 % | Status: AC | PRN
Start: 2020-07-19 — End: 2020-07-19
  Administered 2020-07-19: 14:00:00 250

## 2020-07-19 MED ORDER — OXYCODONE HCL 5 MG PO TABS
5 MG | Freq: Once | ORAL | Status: DC | PRN
Start: 2020-07-19 — End: 2020-07-19

## 2020-07-19 MED ORDER — HYDROMORPHONE 0.5MG/0.5ML IJ SOLN
1 MG/ML | Status: DC | PRN
Start: 2020-07-19 — End: 2020-07-19

## 2020-07-19 MED ORDER — LIDOCAINE HCL (PF) 2 % IJ SOLN
2 | INTRAMUSCULAR | Status: AC
Start: 2020-07-19 — End: 2020-07-19

## 2020-07-19 MED ORDER — DIPHENHYDRAMINE HCL 50 MG/ML IJ SOLN
50 MG/ML | Freq: Once | INTRAMUSCULAR | Status: DC | PRN
Start: 2020-07-19 — End: 2020-07-19

## 2020-07-19 MED ORDER — PROPOFOL 200 MG/20ML IV EMUL
200 | INTRAVENOUS | Status: AC
Start: 2020-07-19 — End: 2020-07-19

## 2020-07-19 MED ORDER — NORMAL SALINE FLUSH 0.9 % IV SOLN
0.9 % | Freq: Two times a day (BID) | INTRAVENOUS | Status: DC
Start: 2020-07-19 — End: 2020-07-19

## 2020-07-19 MED ORDER — LIDOCAINE HCL (PF) 1 % IJ SOLN
1 % | Freq: Once | INTRAMUSCULAR | Status: AC | PRN
Start: 2020-07-19 — End: 2020-07-19
  Administered 2020-07-19: 14:00:00 3

## 2020-07-19 MED ORDER — LIDOCAINE HCL (PF) 2 % IJ SOLN
2 % | INTRAMUSCULAR | Status: DC | PRN
Start: 2020-07-19 — End: 2020-07-19
  Administered 2020-07-19: 13:00:00 100 via INTRAVENOUS

## 2020-07-19 MED ORDER — MEPERIDINE HCL 25 MG/ML IJ SOLN
25 MG/ML | Freq: Once | INTRAMUSCULAR | Status: DC
Start: 2020-07-19 — End: 2020-07-19

## 2020-07-19 MED ORDER — PROPOFOL 200 MG/20ML IV EMUL
200 MG/20ML | INTRAVENOUS | Status: DC | PRN
Start: 2020-07-19 — End: 2020-07-19
  Administered 2020-07-19: 13:00:00 180 via INTRAVENOUS

## 2020-07-19 MED FILL — XYLOCAINE-MPF 2 % IJ SOLN: 2 % | INTRAMUSCULAR | Qty: 5

## 2020-07-19 MED FILL — DIPRIVAN 200 MG/20ML IV EMUL: 200 MG/20ML | INTRAVENOUS | Qty: 20

## 2020-07-19 NOTE — Discharge Instructions (Signed)
Post-Operative Instructions    Trigger Finger Release:    ? Keep hand elevated with fingers above eye-level to control swelling.  ? Keep hand and bandage clean and dry.  ? Do not change or unwrap bandage.  Please leave bandage in place until your follow-up appointment.  ? Work hard on finger motion starting immediately.  ? Several times each hour, fully straighten and fully bend fingers and thumb completely.  You may use your other hand to help as needed.  This may cause some discomfort, but will not damage your surgery.  ? You may use your operated hand for lightweight tasks (e.g. writing, eating, dressing, etc.).  NO LIFTING, CARRYING OR HEAVY USE.    ? Most Patients do not have "Serious Pain" after this procedure and thus most patients do not require prescription pain medication.  ? You may take over the counter medication (Tylenol, Advil, Aleve, etc.) as needed.  If you are unable to tolerate the discomfort after your surgery and the OTC medications do not provide some relief, you may contact our office to discuss other options..    ? Please call the office at [phone]  in 24 - 48 hours to schedule a follow up appointment for approximately 7 - 10 days after surgery.  ? Please call the office at [phone]  if you have any questions or problems.           Pinkie Manger B. Aurther Harlin, MD

## 2020-07-19 NOTE — Anesthesia Pre-Procedure Evaluation (Signed)
[facility] Department of Anesthesiology  Pre-Anesthesia Evaluation/Consultation       Name:  Jeremiah Guzman  DOB: 04-03-1960  Age:  60 y.o.                                           MRN:  [id number]  Date: 07/19/2020           Surgeon: Surgeon(s):  Delene Loll, MD    Procedure: Procedure(s):  LEFT THUMB AND RING FINGER TRIGGER FINGER RELEASE     No Known Allergies  Patient Active Problem List   Diagnosis   ??? Obesity   ??? Hyperlipidemia   ??? HTN (hypertension)   ??? Cancer of skin   ??? Jaw swelling   ??? Jaw pain   ??? Type 1 diabetes mellitus, uncontrolled ([place])   ??? Cervical lymphadenopathy   ??? Diabetes type 1, uncontrolled ([place])   ??? Carpal tunnel syndrome     Past Medical History:   Diagnosis Date   ??? Basal cell carcinoma 08/2013    left nose   ??? Diabetes type 1, uncontrolled ([place])    ??? Erectile dysfunction    ??? HBP (high blood pressure) 03/27/2008   ??? Hyperlipidemia 03/27/2008   ??? Obesity 03/27/2008     Past Surgical History:   Procedure Laterality Date   ??? CARPAL TUNNEL RELEASE Left 12/25/2018    LEFT CARPAL TUNNEL RELEASE performed by Delene Loll, MD at [place]   ??? Patriot Right 06/23/2020    RIGHT CARPAL TUNNEL RELEASE performed by Delene Loll, MD at [place]   ??? COLONOSCOPY  01-06-2007    neg Dr. Beatrix Shipper   ??? COLONOSCOPY N/A 02/18/2018    COLORECTAL CANCER SCREENING, NOT HIGH RISK performed by Orvan Seen, MD at [place]   ??? SKIN BIOPSY     ??? SKIN CANCER EXCISION Left 08/2013    nose   ??? TONSILLECTOMY       Social History     Tobacco Use   ??? Smoking status: Never Smoker   ??? Smokeless tobacco: Former Systems developer     Types: Scientist, physiological   ??? Vaping Use: Never used   Substance Use Topics   ??? Alcohol use: Yes     Comment: weekends   ??? Drug use: Never     Medications  Current Facility-Administered Medications on File Prior to Visit   Medication Dose Route Frequency Provider Last Rate Last Admin   ??? sodium chloride flush 0.9 % injection 5-40 mL  5-40 mL IntraVENous 2 times per day Clide Cliff, MD        ??? sodium chloride flush 0.9 % injection 5-40 mL  5-40 mL IntraVENous PRN Clide Cliff, MD       ??? 0.9 % sodium chloride infusion   IntraVENous PRN Clide Cliff, MD         Current Outpatient Medications on File Prior to Visit   Medication Sig Dispense Refill   ??? etodolac (LODINE) 400 MG tablet Take 400 mg by mouth 2 times daily     ??? insulin glargine (LANTUS SOLOSTAR) 100 UNIT/ML injection pen Inject 55 Units into the skin daily 15 pen 1   ??? lisinopril (PRINIVIL;ZESTRIL) 10 MG tablet Take 1 tablet by mouth daily 90 tablet 1   ??? simvastatin (ZOCOR) 40 MG tablet Take  1 tablet by mouth nightly (Patient taking differently: Take 40 mg by mouth daily ) 90 tablet 1   ??? Continuous Blood Gluc Receiver (FREESTYLE LIBRE 2 READER) DEVI Use as directed re: DMI (E10.65) 1 each 0   ??? Continuous Blood Gluc Sensor (FREESTYLE LIBRE 2 SENSOR) MISC Use as directed re: DMI (E10.65) 6 each 1   ??? tamsulosin (FLOMAX) 0.4 MG capsule Take 1 capsule by mouth daily 90 capsule 1   ??? naproxen (NAPROSYN) 500 MG tablet Take 1 tablet by mouth 2 times daily (with meals) (Patient not taking: Reported on 07/14/2020) 180 tablet 1   ??? sitaGLIPtan-metFORMIN (JANUMET) 50-500 MG per tablet Take 1 tablet by mouth 2 times daily (with meals) 180 tablet 3   ??? sildenafil (VIAGRA) 100 MG tablet Take 1 tablet by mouth as needed for Erectile Dysfunction (Patient not taking: Reported on 07/19/2020) 30 tablet 3   ??? FREESTYLE LITE strip USE THREE TIMES A DAY AS NEEDED 300 each 3   ??? insulin lispro (HUMALOG KWIKPEN) 100 UNIT/ML pen Inject 10 Units into the skin 3 times daily (before meals) (Patient taking differently: Inject into the skin daily Sliding scale:  If 200 or greater takes 12 units                          If 250 or greater takes 15 units                          If 300 or greater takes 18 units) 15 pen 3   ??? glucose monitoring kit (FREESTYLE) monitoring kit 1 kit by Does not apply route three times daily 1 kit 0   ??? Lancets MISC Test TID with freestyle meter  300 each 3   ??? Insulin Pen Needle (B-D ULTRAFINE III SHORT PEN) 31G X 8 MM MISC Patient test BID  Dx 250.00 300 each 3   ??? Insulin Syringe-Needle U-100 31G X 5/16" 1 ML MISC 1 each by Does not apply route daily 100 each 5   ??? Multiple Vitamin TABS Take 1 tablet by mouth daily      ??? aspirin 81 MG tablet Take 81 mg by mouth daily.       No current facility-administered medications for this visit.     No current outpatient medications on file.     Facility-Administered Medications Ordered in Other Visits   Medication Dose Route Frequency Provider Last Rate Last Admin   ??? sodium chloride flush 0.9 % injection 5-40 mL  5-40 mL IntraVENous 2 times per day Clide Cliff, MD       ??? sodium chloride flush 0.9 % injection 5-40 mL  5-40 mL IntraVENous PRN Clide Cliff, MD       ??? 0.9 % sodium chloride infusion   IntraVENous PRN Clide Cliff, MD         Vital Signs (Current)   There were no vitals filed for this visit.  Vital Signs Statistics (for past 48 hrs)     Temp  Avg: 96.8 ??F (36 ??C)  Min: 96.8 ??F (36 ??C)   Min taken time: 07/19/20 0814  Max: 96.8 ??F (36 ??C)   Max taken time: 07/19/20 0822  Pulse  Avg: 75  Min: 75   Min taken time: 07/19/20 4818  Max: 75   Max taken time: 07/19/20 0822  Resp  Avg: 16  Min: 16   Min taken time: 07/19/20  1761  Max: 80   Max taken time: 07/19/20 0822  BP  Min: 149/86   Min taken time: 07/19/20 0822  Max: 149/86   Max taken time: 07/19/20 0822  SpO2  Avg: 96 %  Min: 96 %   Min taken time: 07/19/20 6073  Max: 96 %   Max taken time: 07/19/20 0822    BP Readings from Last 3 Encounters:   07/19/20 (!) 149/86   06/23/20 (!) 142/87   06/10/20 135/81     BMI  There is no height or weight on file to calculate BMI.  Estimated body mass index is 35.54 kg/m?? as calculated from the following:    Height as of an earlier encounter on 07/19/20: _0  (1.803 m).    Weight as of an earlier encounter on 07/19/20: 254 lb 13.6 oz (115.6 kg).    CBC   Lab Results   Component Value Date/Time    WBC 7.3 11/03/2018  09:55 AM    RBC 5.31 11/03/2018 09:55 AM    HGB 16.9 11/03/2018 09:55 AM    HCT 51.6 11/03/2018 09:55 AM    MCV 97.1 11/03/2018 09:55 AM    RDW 12.8 11/03/2018 09:55 AM    PLT 217 11/03/2018 09:55 AM     CMP    Lab Results   Component Value Date/Time    NA 140 02/19/2020 11:14 AM    K 4.4 02/19/2020 11:14 AM    CL 101 02/19/2020 11:14 AM    CO2 25 02/19/2020 11:14 AM    BUN 10 02/19/2020 11:14 AM    CREATININE 0.7 02/19/2020 11:14 AM    GFRAA >60 02/19/2020 11:14 AM    GFRAA >60 04/10/2011 12:27 PM    AGRATIO 1.5 11/03/2018 09:55 AM    LABGLOM >60 02/19/2020 11:14 AM    GLUCOSE 171 02/19/2020 11:14 AM    PROT 6.9 11/03/2018 09:55 AM    PROT 6.5 06/29/2010 10:00 AM    CALCIUM 9.3 02/19/2020 11:14 AM    BILITOT 0.7 11/03/2018 09:55 AM    ALKPHOS 67 11/03/2018 09:55 AM    AST 25 02/19/2020 11:14 AM    ALT 36 02/19/2020 11:14 AM     BMP    Lab Results   Component Value Date/Time    NA 140 02/19/2020 11:14 AM    K 4.4 02/19/2020 11:14 AM    CL 101 02/19/2020 11:14 AM    CO2 25 02/19/2020 11:14 AM    BUN 10 02/19/2020 11:14 AM    CREATININE 0.7 02/19/2020 11:14 AM    CALCIUM 9.3 02/19/2020 11:14 AM    GFRAA >60 02/19/2020 11:14 AM    GFRAA >60 04/10/2011 12:27 PM    LABGLOM >60 02/19/2020 11:14 AM    GLUCOSE 171 02/19/2020 11:14 AM     POCGlucose  No results for input(s): GLUCOSE in the last 72 hours.   Coags  No results found for: PROTIME, INR, APTT  HCG (If Applicable) No results found for: PREGTESTUR, PREGSERUM, HCG, HCGQUANT   ABGs No results found for: PHART, PO2ART, PCO2ART, HCO3ART, BEART, O2SATART   Type & Screen (If Applicable)  No results found for: LABABO, LABRH                         BMI: Wt Readings from Last 3 Encounters:       NPO Status:  Anesthesia Evaluation  Patient summary reviewed no history of anesthetic complications:   Airway: Mallampati: III  TM distance: >3 FB   Neck ROM: full  Mouth opening: > = 3 FB   Dental: normal exam         Pulmonary:Negative Pulmonary ROS and  normal exam                               Cardiovascular:  Exercise tolerance: good (>4 METS),   (+) hypertension:,         Rhythm: regular  Rate: normal           Beta Blocker:  Not on Beta Blocker         Neuro/Psych:   Negative Neuro/Psych ROS     (-) seizures           GI/Hepatic/Renal: Neg GI/Hepatic/Renal ROS       (-) GERD       Endo/Other:    (+) Diabetesusing insulin, .                 Abdominal:   (+) obese,           Vascular: negative vascular ROS.         Other Findings:             Anesthesia Plan      MAC     ASA 3       Induction: intravenous.    MIPS: Postoperative opioids intended and Prophylactic antiemetics administered.  Anesthetic plan and risks discussed with patient.      Plan discussed with CRNA.                This pre-anesthesia assessment may be used as a history and physical.    DOS STAFF ADDENDUM:    Pt seen and examined, chart reviewed (including anesthesia, drug and allergy history).  No interval changes to history and physical examination.  Anesthetic plan, risks, benefits, alternatives, and personnel involved discussed with patient. Questions and concerns addressed.  Patient(family) verbalized an understanding and agrees to proceed.      Lesly Dukes, MD  July 19, 2020  8:38 AM

## 2020-07-19 NOTE — H&P (Signed)
Pre-operative Update of H&P:    I  have seen & examined [name] related solely to his hand and upper extremity conditions, prior to the scheduled procedure on the date of his surgery.  The indications for the planned surgical procedure & and his upper-extremity condition are unchanged.

## 2020-07-19 NOTE — Anesthesia Post-Procedure Evaluation (Signed)
Department of Anesthesiology  Postprocedure Note    Patient: Jeremiah Guzman  MRN: [id number]  Birthdate: 1960/06/27  Date of evaluation: 07/19/2020      Procedure Summary     Date: 07/19/20 Room / Location: [facility] OR 03 / [facility]    Anesthesia Start: [phone] Anesthesia Stop: 1308    Procedure: LEFT THUMB AND RING FINGER TRIGGER FINGER RELEASE (Left ) Diagnosis:       Trigger thumb of left hand      Trigger ring finger of left hand      (LEFT THUMB AND RING FINGER TRIGGER FINGER)    Surgeons: Delene Loll, MD Responsible Provider: Lesly Dukes, MD    Anesthesia Type: MAC ASA Status: 3          Anesthesia Type: No value filed.    Aldrete Phase I: Aldrete Score: 8    Aldrete Phase II: Aldrete Score: 10      Anesthesia Post Evaluation    Patient location during evaluation: PACU  Patient participation: complete - patient participated  Level of consciousness: awake and alert  Pain score: 0  Nausea & Vomiting: no nausea  Complications: no  Cardiovascular status: hemodynamically stable  Respiratory status: acceptable  Hydration status: stable

## 2020-07-19 NOTE — Progress Notes (Signed)
Pt arrived to PACU from OR. Pt asleep on 4l/nc. Left hand dressing C/D/I. Fingers warm. Cap refill WNL.

## 2020-07-19 NOTE — Progress Notes (Signed)
Received from PACU. Admitted to Phase 2 care.  Awake and alert, respirations easy and even.  Oriented to room and surroundings.  No complaints.

## 2020-07-19 NOTE — Progress Notes (Signed)
Tolerating oral intake and being up in chair.  Discharge instructions given to patient, and wife per phone.  Verbalize understanding.  No complaints.

## 2020-07-19 NOTE — Op Note (Signed)
OPERATIVE REPORT          Patient:  Jeremiah Guzman    Date of Birth:  1960-06-14  Date of Service:  07/19/2020   Location:  [facility] - Main OR        Preoperative Diagnosis:  Left Thumb and Ring Finger trigger finger.    Postoperative Diagnosis: Same.    Procedure: Left Thumb and Ring Finger trigger finger release (A1 pulley release).    Surgeon:    Jeri Cos. Anne Hahn, MD    Surgical Assistant:    [facility] Supplied Assistant    Anesthesia:  Local with sedation.    Blood Loss:  Minimal.     Complications:  None.      Tourniquet Time:  5 minutes.    Indications:  Mr.  Jeremiah Guzman  is a 60 y.o.  year old male with a Left Thumb and Ring Finger trigger finger. I have discussed preoperatively with him the complications, limitations, expectations, alternatives and risks of the planned surgical care.  He has voiced an understanding of our discussion.  All of his questions have been fully answered to his satisfaction, and he has provided written informed consent to proceed.    Procedure:  After written consent was obtained and the proper operative site was identified and marked, [name]  was brought to the operating room, placed in the supine position on the operating room table with the Left arm extended upon a hand table. Under an appropriate level of sedation, local anesthetic (1% Lidocaine and 1/2% Marcaine both without Epinephrine) was instilled in the planned surgical field. His Left upper extremity was prepped and draped in the usual sterile fashion.    After Eshmarch exsanguination the pneumo-tourniquet was inflated to 250 milimeters of mercury.      A 1 centimeter oblique incision was fashioned over the base of the flexor tendon sheath of the Left Thumb.  Dissection was carried carefully through the subcutaneous tissues, taking great care to identify and protect the neurovascular structures.  The flexor tendon sheath was carefully identified and cleared of surrounding soft tissue. The A1 pulley  was identified and incised longitudinally along its entire length under direct visualization. The flexor tendons were gently withdrawn from the sheath and inspected.  They were found to be in good condition.  The tendons were returned to their appropriate location and the finger was placed through a full range of motion. There was no evidence of residual stenosis or triggering.      A 1 centimeter oblique incision was fashioned over the base of the flexor tendon sheath of the Left Ring Finger.  Dissection was carried carefully through the subcutaneous tissues, taking great care to identify and protect the neurovascular structures.  The flexor tendon sheath was carefully identified and cleared of surrounding soft tissue. The A1 pulley was identified and incised longitudinally along its entire length under direct visualization. The flexor tendons were gently withdrawn from the sheath and inspected.  They were found to be in good condition.  The tendons were returned to their appropriate location and the finger was placed through a full range of motion. There was no evidence of residual stenosis or triggering.      The wound was irrigated copiously with sterile saline for irrigation and the pneumo-tourniquet was deflated after a period of 5 minutes elevation. Hemostasis was easily obtained with direct pressure and electrocautery.  The wound was closed with interrupted sutures in the skin. The  wound was dressed with adaptic, dry sterile gauze, and a bulky, hand based dressing was applied. [name]  was awakened from light sedation, having tolerated the procedure without apparent complication, and was returned to the recovery room in stable condition.    At the conclusion of the procedure all needle, instrument, and sponge counts were correct.               [name], MD   07/19/2020, 9:41 AM

## 2020-07-25 ENCOUNTER — Ambulatory Visit: Admit: 2020-07-25 | Discharge: 2020-07-25 | Payer: PRIVATE HEALTH INSURANCE

## 2020-07-25 DIAGNOSIS — M6530 Trigger finger, unspecified finger: Secondary | ICD-10-CM

## 2020-07-25 NOTE — Patient Instructions (Signed)
Postoperative Instructions After Trigger Finger Release    Dr. Craig B. Willis          After bandages are removed one week from surgery, you may chose to wear a small bandage over the incision if you wish, though you do not need to.  Keep incision dry until sutures have fully dissolved  or it has been 14 days since your surgery. Thereafter, you may wash with mild soap and water and shower normally.   Once your stiches have fully disappeared & skin appears normal, you should begin gently massaging the incision with Vitamin E (may use Vitamin E lotion or contents of Vitamin E capsule).   Work hard on motion of the fingers and wrist, straightening each finger fully and bending each finger fully, bending wrist forward and bending wrist backwards. Do not be concerned if you experience discomfort.  This will not damage the surgery.  You may begin using the hand as it feels comfortable beginning 12-14 days from the day of surgery. You may not feel entirely comfortable gripping or lifting heavy objects for several weeks.  You may expect to see some skin ???peel??? off around the incision.  You may be left with a small area of ???pink baby skin???. This is quite normal.    Thank you for choosing [facility] for your Hand and Upper Extremity needs.  If we can be of any further assistance to you, please do not hesitate to contact us.    Office Phone Number:  [phone]  or  [phone]

## 2020-07-25 NOTE — Progress Notes (Signed)
[name] returns today in follow-up of his recent left Thumb and Ring Finger A1 Pulley (Trigger Finger) Release done approximately 1 week ago.  He has done well noting no discomfort and no other reported complications.    He notes pre-operative symptoms to be completely resolved at this time.    Physical Exam:  Bandage intact and well cared for  Skin incision is healing well.  Digital range of motion is limited by pain in the Ring Finger, normal in all other digits.  Wrist range of motion is full.  Sensation is normal in the Thumb and Ring Finger.  Vascular examination reveals normal, good capillary refill, and good color.  Swelling is minimal.  His preoperative triggering is completely resolved.    Impression:  [name] is doing well after recent left Thumb and Ring Finger Trigger Finger Release.    Plan:  [name] is instructed in work on Active & Passive range of motion of the digits, wrist, & elbow.  These modalities were specifically demonstrated to him today.  We discussed the appropriateness of gradual resumption of use of the operated hand and the return to normal use as comfort allows.  He is given instructions regarding management of the fresh surgical incision and progressive use of desensitization and tissue massage techniques.  We discussed the appropriate expectations and timeline for symptom improvement.    He is provided a written patient instruction sheet titled: Postoperative Instructions After Trigger Finger Release.    I have asked [name] to follow-up with me or contact me by telephone over the next 2-4 weeks if his symptoms have not fully resolved or if he has not regained full & painless return of function.     He is also specifically instructed to return to the office or call for an appointment sooner if his symptoms are changing or worsening prior to that time.

## 2020-08-05 ENCOUNTER — Encounter

## 2020-08-29 ENCOUNTER — Encounter

## 2020-08-29 MED ORDER — LISINOPRIL 10 MG PO TABS
10 MG | ORAL_TABLET | ORAL | 3 refills | Status: AC
Start: 2020-08-29 — End: ?

## 2020-08-29 MED ORDER — SIMVASTATIN 40 MG PO TABS
40 | ORAL_TABLET | ORAL | 3 refills | Status: AC
Start: 2020-08-29 — End: ?

## 2022-10-14 IMAGING — MR MRI LUMBAR SPINE WITHOUT CONTRAST
4 of 8 series · 7 of 48 positions shown · IV contrast (gadolinium)
Comparison: None

________________________________________________________________________________________________ 
MRI LUMBAR SPINE WITHOUT CONTRAST, 10/14/2022 [DATE]: 
CLINICAL INDICATION: Radiculopathy, Lumbar Region
TECHNIQUE: Multiplanar, multiecho position MR images of the lumbar spine were 
performed without intravenous gadolinium enhancement. Patient was scanned on a 
1.5T magnet

[Series 101: survey · axial · 10.0mm · 1.25mm/px · 1 of 10 slices shown]
[im 1/10]
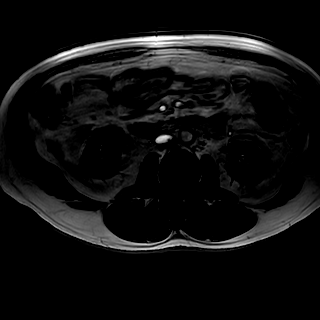

[Series 201: t2w_cor-surv · coronal · 6.0mm · 0.62mm/px · 1 of 10 slices shown]
[im 1/10]
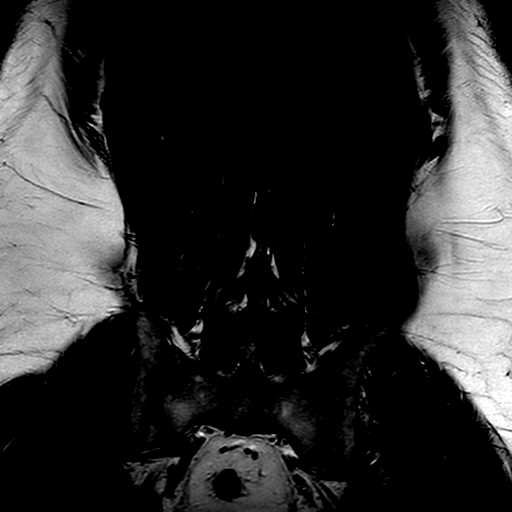

[Series 301: t1_tse_sag · sagittal · 4.0mm · 0.33mm/px · 3 of 19 slices shown]
[im 1/19]
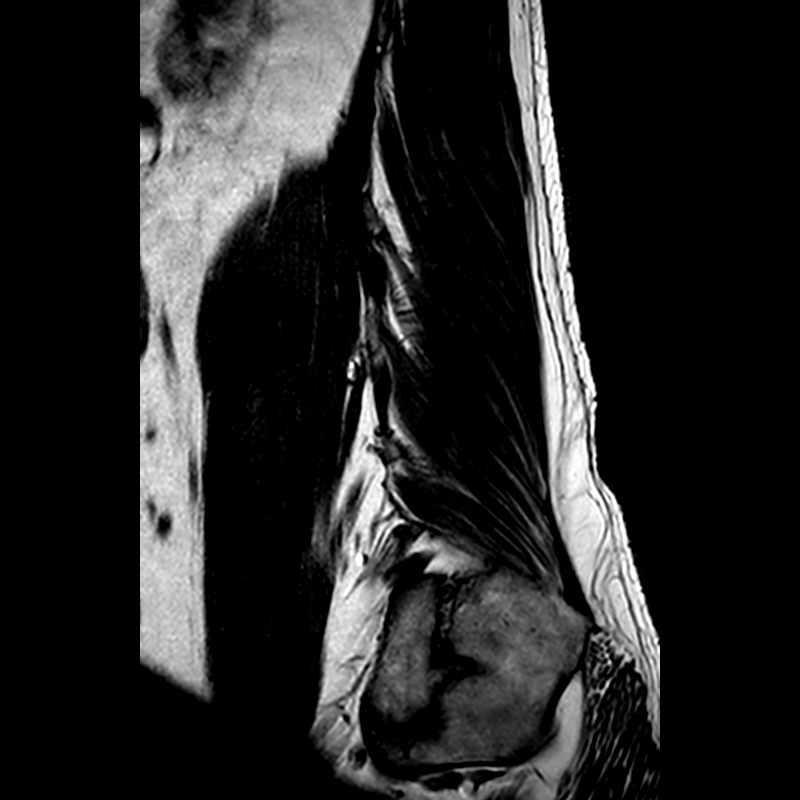
[im 10/19]
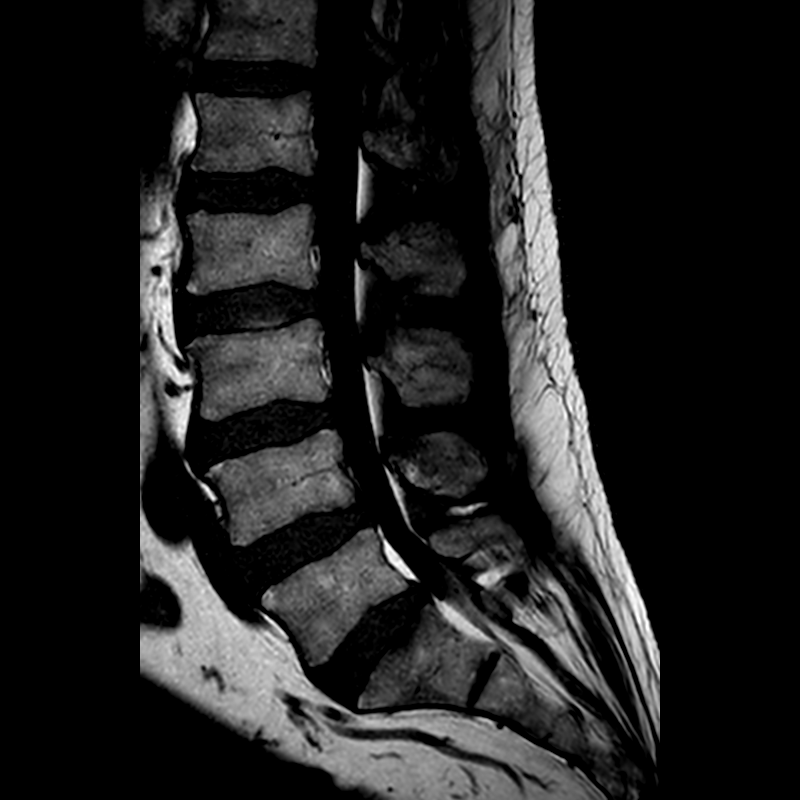
[im 19/19]
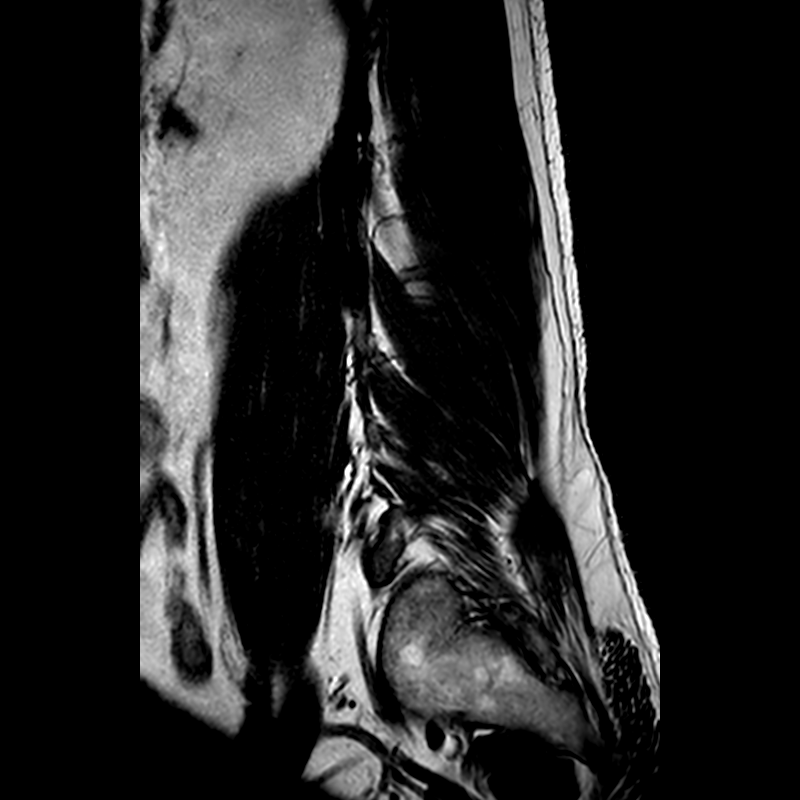

[Series 402: (id) view_ax mpr · axial · 1.0mm · 0.25mm/px · z∈[-147,-91]mm · 2 of 90 slices shown]
[im 14/90]
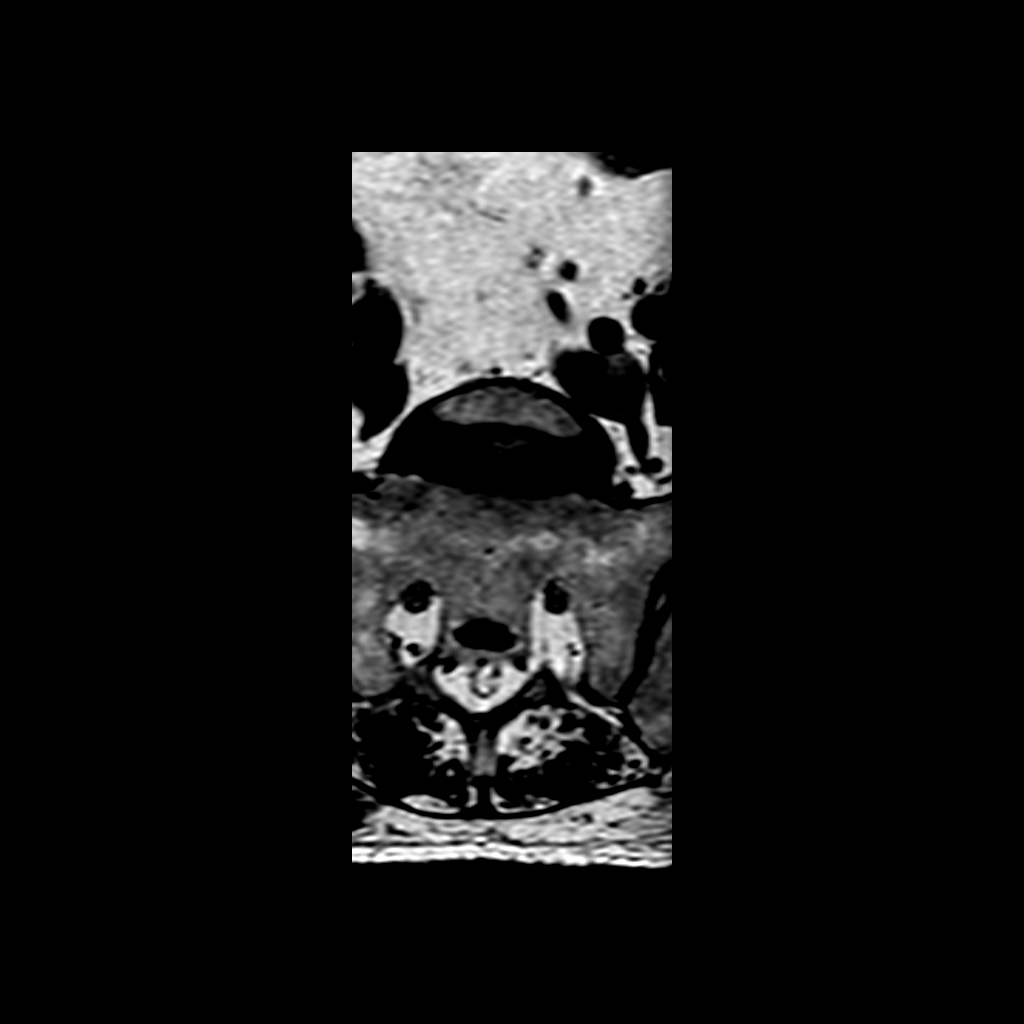
[im 48/90]
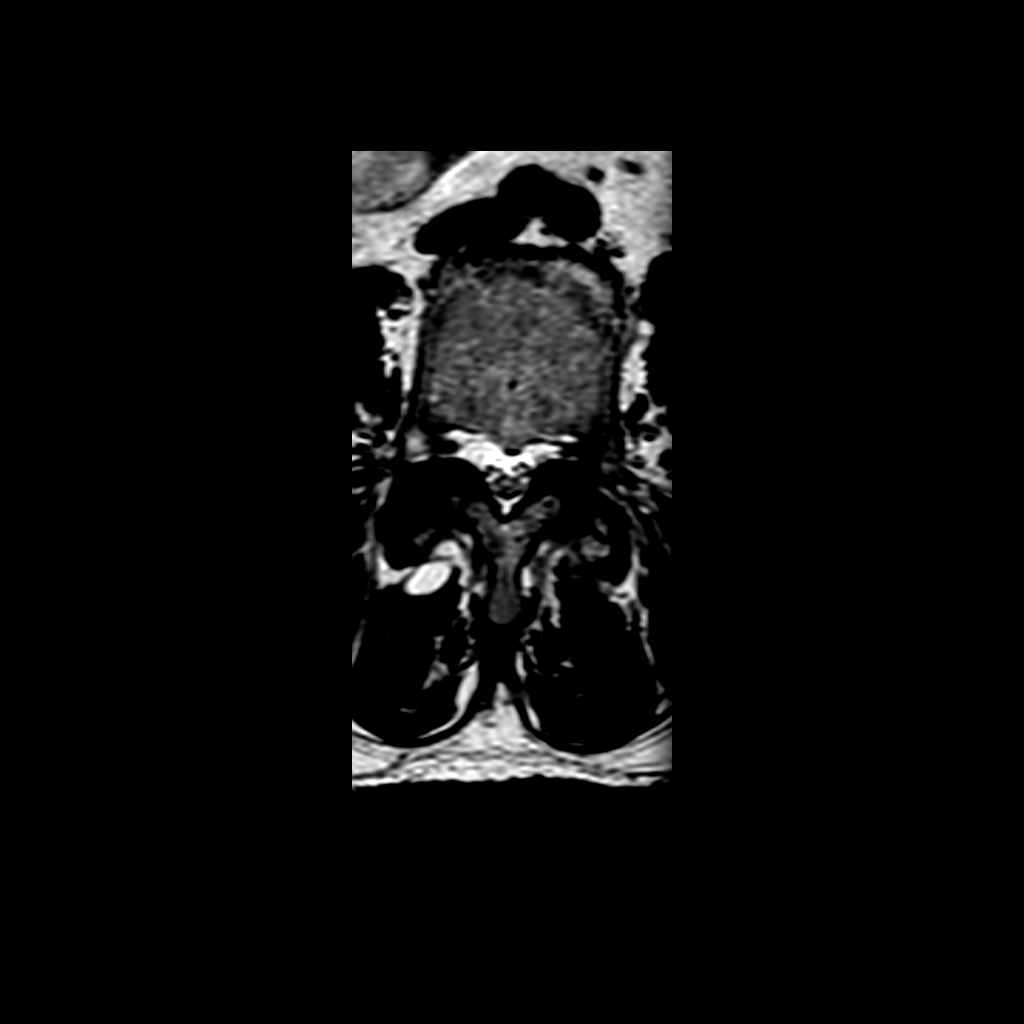

[7 of 48 positions shown; findings below may reference images not displayed]

FINDINGS: GENERAL: 
Nomenclature is based on 5 lumbar type vertebral bodies.     
ALIGNMENT: Normal. 
VERTEBRAE: No fracture. Multilevel osteophytes.  
MARROW SIGNAL: No focal suspect signal abnormality. 
CORD SIGNAL: Normal distal spinal cord and cauda equina. Conus medullaris 
terminates at T12-L1. 
EXTRASPINAL STRUCTURES: Unremarkable. 
Modic I-II: None. 
Ligamentum Flavum > 2.5 mm: All levels 
SEGMENTAL: 
T12-L1: Normal disc height with disc desiccation. No herniation. Normal facets. 
No spinal canal or neural foraminal stenosis. 
L1-L2: Normal disc height with disc desiccation. No herniation. Normal facets. 
Mild central canal/lateral recess stenosis. No neural foraminal stenosis. 
L2-L3: Normal disc height with disc desiccation. No herniation. Normal facets. 
Mild central canal/lateral recess stenosis. No neural foraminal stenosis. 
L3-L4: Disc bulge, disc space narrowing disc desiccation. No herniation. Mild 
facet arthropathy. Moderate central canal stenosis (thecal sac measures 0.8 cm 
in AP dimensions) and mild lateral recess stenosis/neural foraminal stenosis. 
L4-L5: Normal disc height with disc desiccation. No herniation. Mild facet 
arthropathy and small joint effusions. Moderate central canal/lateral recess 
stenosis (thecal sac measures 0.7 cm in AP dimensions). Mild neural foraminal 
stenosis. 
L5-S1: Normal disc height with disc desiccation. No herniation. Moderate facet 
arthropathy and 0.8 cm left posterior synovial cyst. Moderate central canal 
stenosis (thecal sac measures 0.7 cm in AP dimensions) and mild right lateral 
recess stenosis. No neural foraminal stenosis. 
IMPRESSION 
1.  Multifocal degenerative change and spinal stenosis. 
2.  Central canal stenosis is moderate at L3-4 through L5-S1 and mild at the 
remaining levels. Moderate L4-5 lateral recess stenosis and mild multilevel 
lateral recess/neural foraminal stenosis.

## 2022-10-14 IMAGING — MR MRI CERVICAL SPINE WITHOUT CONTRAST
7 of 12 series · 11 of 48 positions shown · IV contrast (gadolinium)
Comparison: None

________________________________________________________________________________________________ 
MRI CERVICAL SPINE WITHOUT CONTRAST, 10/14/2022 [DATE]: 
CLINICAL INDICATION: Radiculopathy, Cervical Region , No trauma. History of skin 
cancer.
TECHNIQUE: Multiplanar, multiecho position MR images of the cervical spine were 
performed without intravenous gadolinium enhancement. Patient was scanned on a 
1.5T magnet.

[Series 101: survey · axial · 10.0mm · 1.25mm/px · z∈[-6,+200]mm · 2 of 10 slices shown]
[im 1/10]
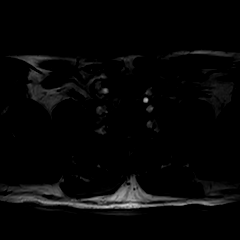
[im 10/10]
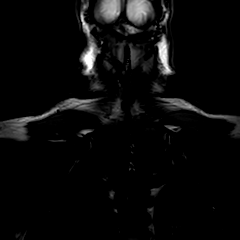

[Series 201: t2w_cor-surv · coronal · 5.0mm · 0.69mm/px · 1 of 6 slices shown]
[im 1/6]
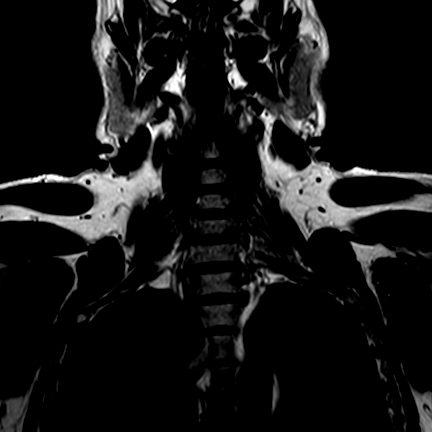

[Series 301: T1 · sagittal · 3.0mm · 0.39mm/px · 1 of 15 slices shown]
[im 1/15]
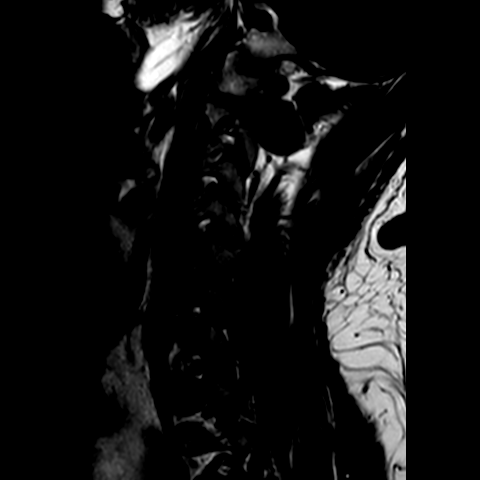

[Series 402: (id)_mdixon_tse · sagittal · 3.0mm · 0.35mm/px · 1 of 15 slices shown]
[im 1/15]
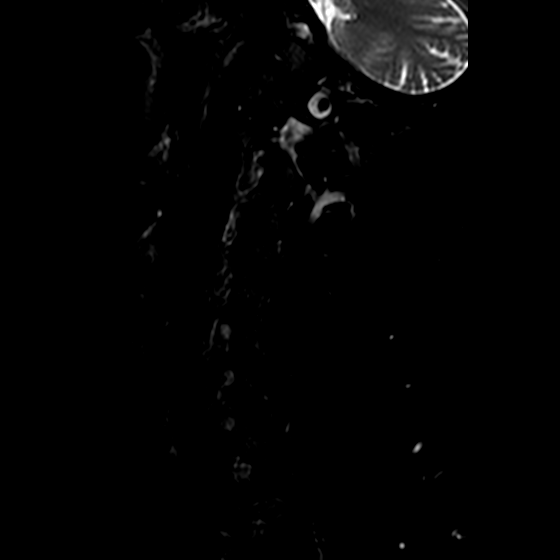

[Series 403: st2w_mdixon_tse · sagittal · 3.0mm · 0.35mm/px · 1 of 15 slices shown]
[im 1/15]
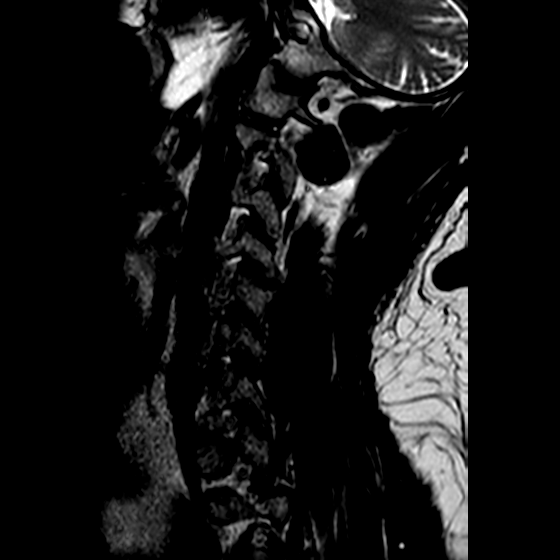

[Series 504: csp left oblq · oblique · 1.0mm · 0.15mm/px · 3 of 30 slices shown]
[im 1/30]
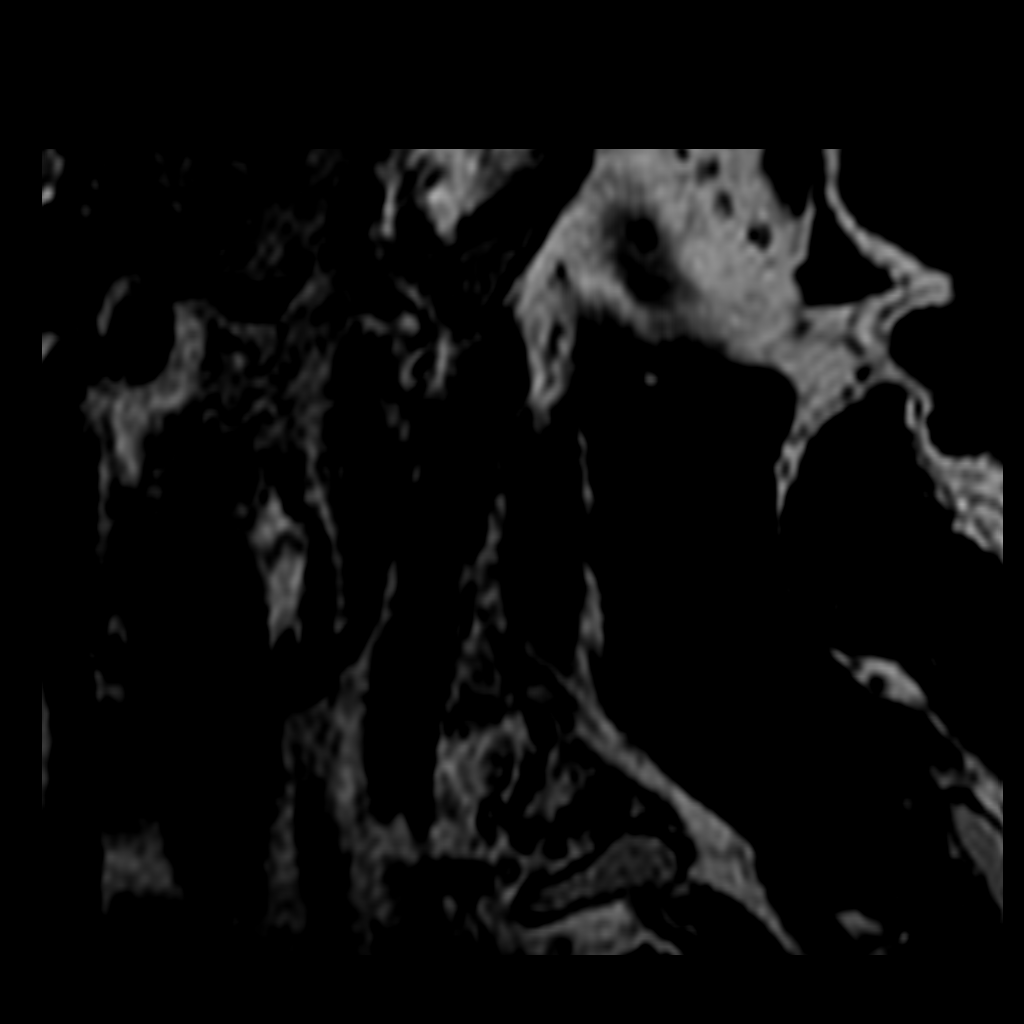
[im 15/30]
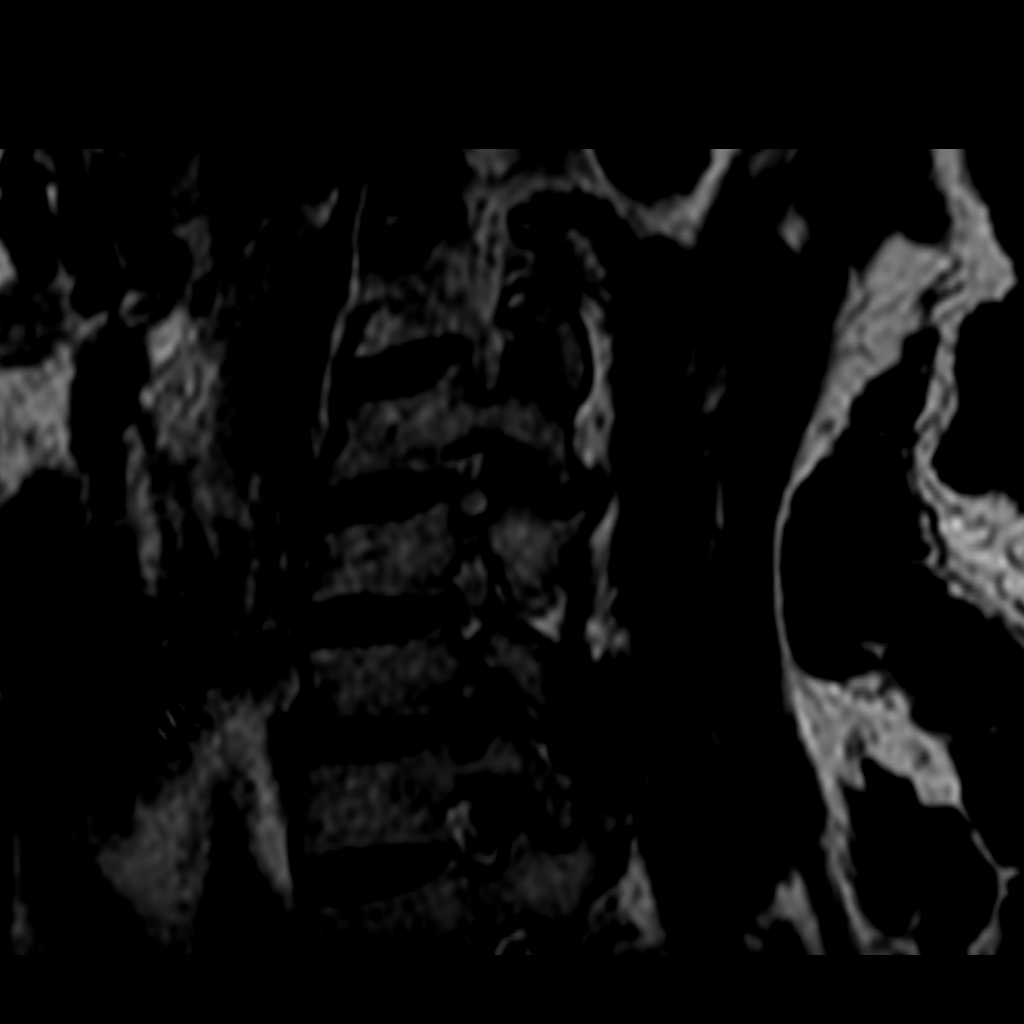
[im 30/30]
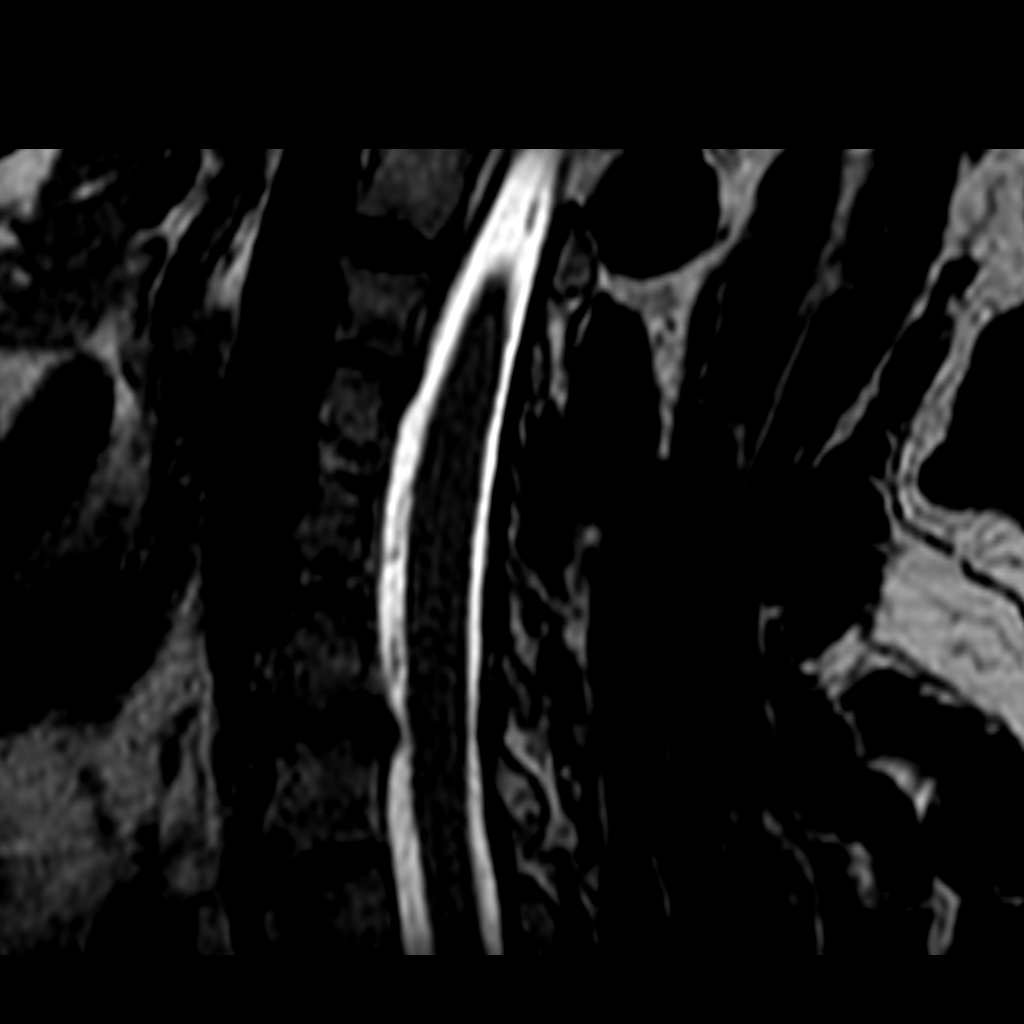

[Series 601: T2 · axial · 3.0mm · 0.29mm/px · z∈[-28,+73]mm · 2 of 18 slices shown]
[im 1/18]
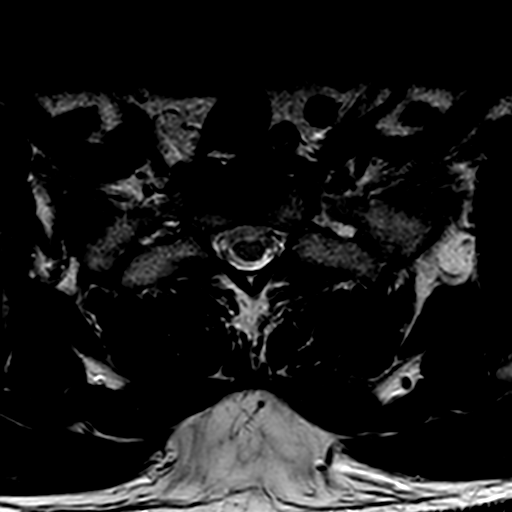
[im 18/18]
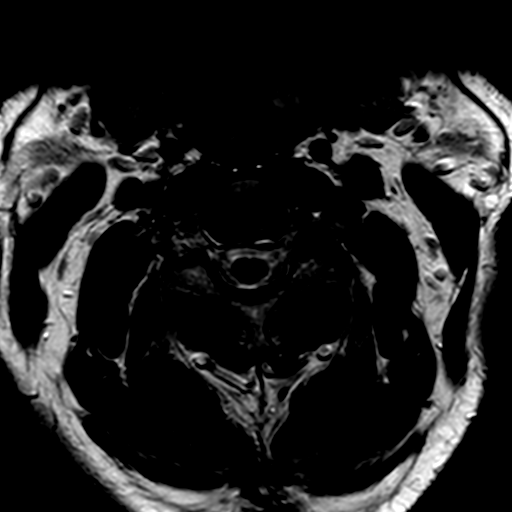

[11 of 48 positions shown; findings below may reference images not displayed]

FINDINGS: -------------------------------------------------------------------------------- 
----------------- 
GENERAL: 
ALIGNMENT: Normal coronal alignment. Loss of the normal cervical lordosis with 1 
mm retrolisthesis C6 on C7. 
VERTEBRAL BODY HEIGHT: Normal.  
MARROW SIGNAL: No focal suspect signal abnormality. 
CORD SIGNAL: Normal.  
ADDITIONAL FINDINGS: None. 
-------------------------------------------------------------------------------- 
---------------- 
SEGMENTAL: 
CRANIOCERVICAL JUNCTION: No significant stenosis. 
C2-C3: Normal disc height. Minimal 1 mm left paracentral disc protrusion. Canal 
and right foramen are patent. Moderate left foraminal narrowing with left-sided 
facet arthropathy. 
C3-C4: Normal disc height. Canal and foramina are patent. Mild right-sided facet 
arthropathy. 
C4-C5: Normal disc height. Minimal diffuse annular bulge with borderline canal 
stenosis. No ventral cord distortion. Patent right foramen. Borderline left 
foraminal narrowing. Normal facets. 
C5-C6: Normal disc height. 2 mm central disc protrusion with very slight ventral 
cord distortion. Canal otherwise patent. Foramina patent. Normal facets. 
C6-C7: Mild loss of disc height. Central and left paracentral disc extrusion 
with disc material extending superior and inferior to the endplate. This abuts, 
indents, and distorts the ventral cord margin with moderate canal stenosis and 
moderate left lateral recess narrowing. Bilateral uncinate spurring with 
moderately severe right and severe left foraminal narrowing. There may be a 
component of disc that extends into the left neural foramen. Mild facet 
arthropathy. 
C7-T1: Normal disc height. Canal and foramina are patent. Normal facets. 
-------------------------------------------------------------------------------- 
---------------
IMPRESSION: Multilevel cervical spondylosis most significant at C6-C7. Central left 
paracentral disc extrusion at this level creates ventral cord distortion and 
moderate canal stenosis with moderate left lateral recess narrowing. Moderately 
severe right and severe left foraminal narrowing. 
Less significant degenerative changes detailed above.

## 2023-02-04 IMAGING — DX CERVICAL SPINE 2 VIEWS
1 series · 3 of 3 positions shown · non-contrast
Comparison: None

________________________________________________________________________________________________ 
CERVICAL SPINE 2 VIEWS, 02/04/2023 [DATE]: 
CLINICAL INDICATION: Arthrodesis Status. Spinal Stenosis, Cervical Region

[Series 1: lateral · 0.14mm/px · 3 of 3 slices shown]
[im 1/3]
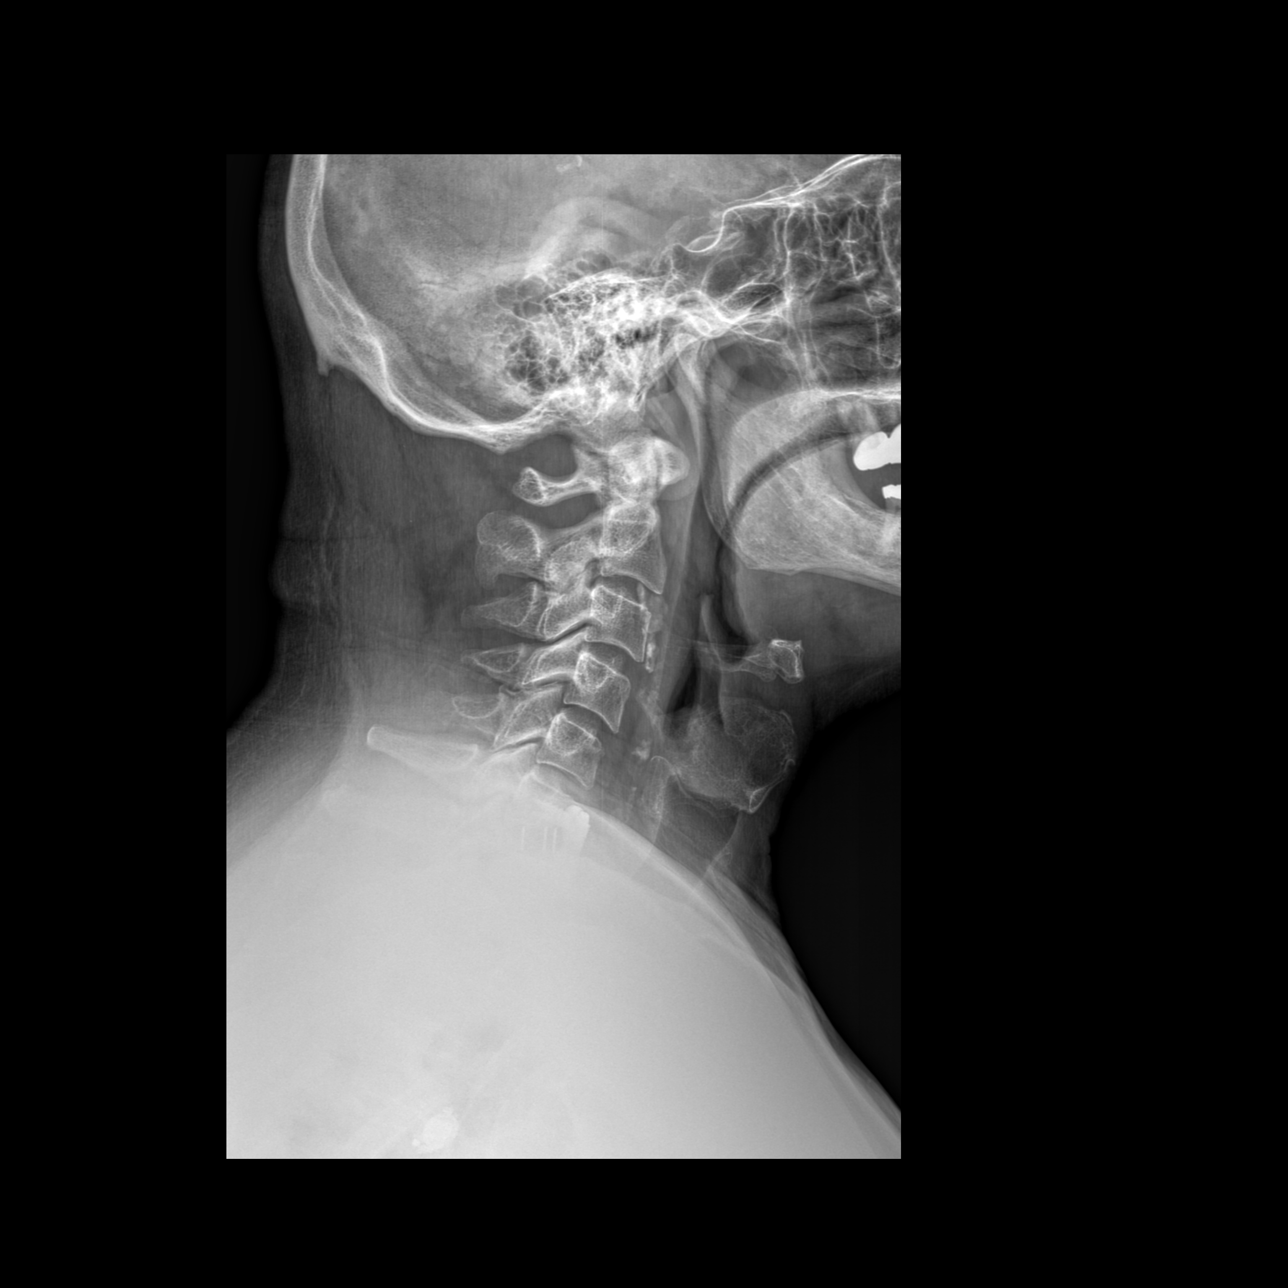
[im 2/3]
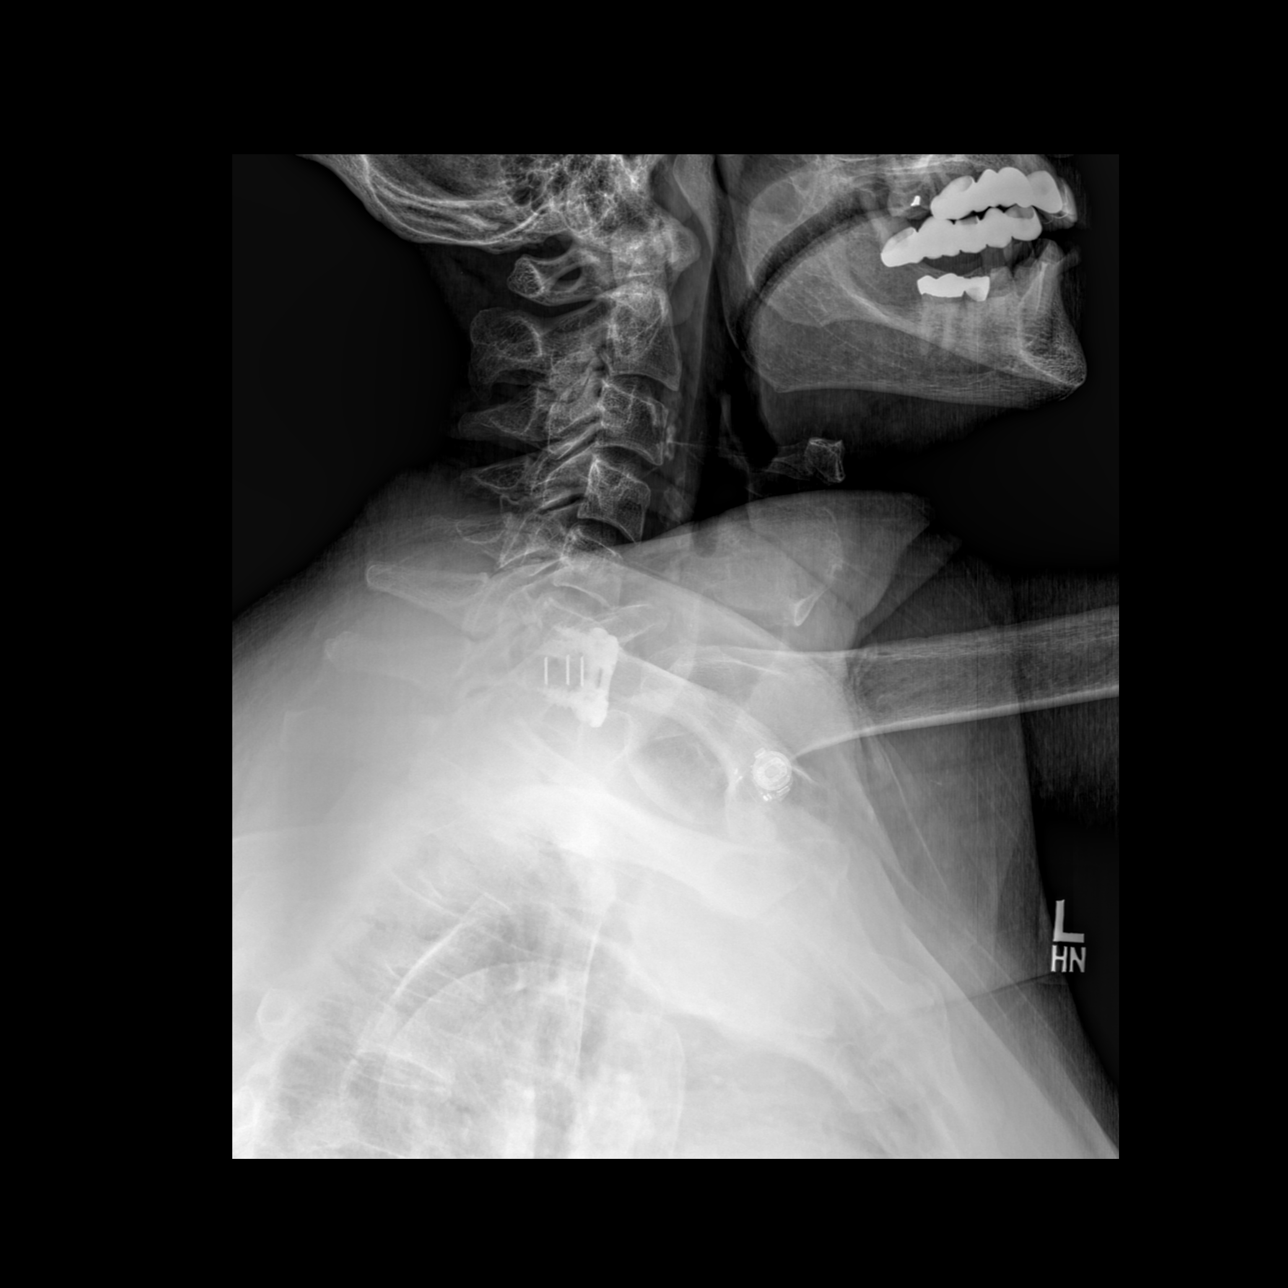
[im 3/3]
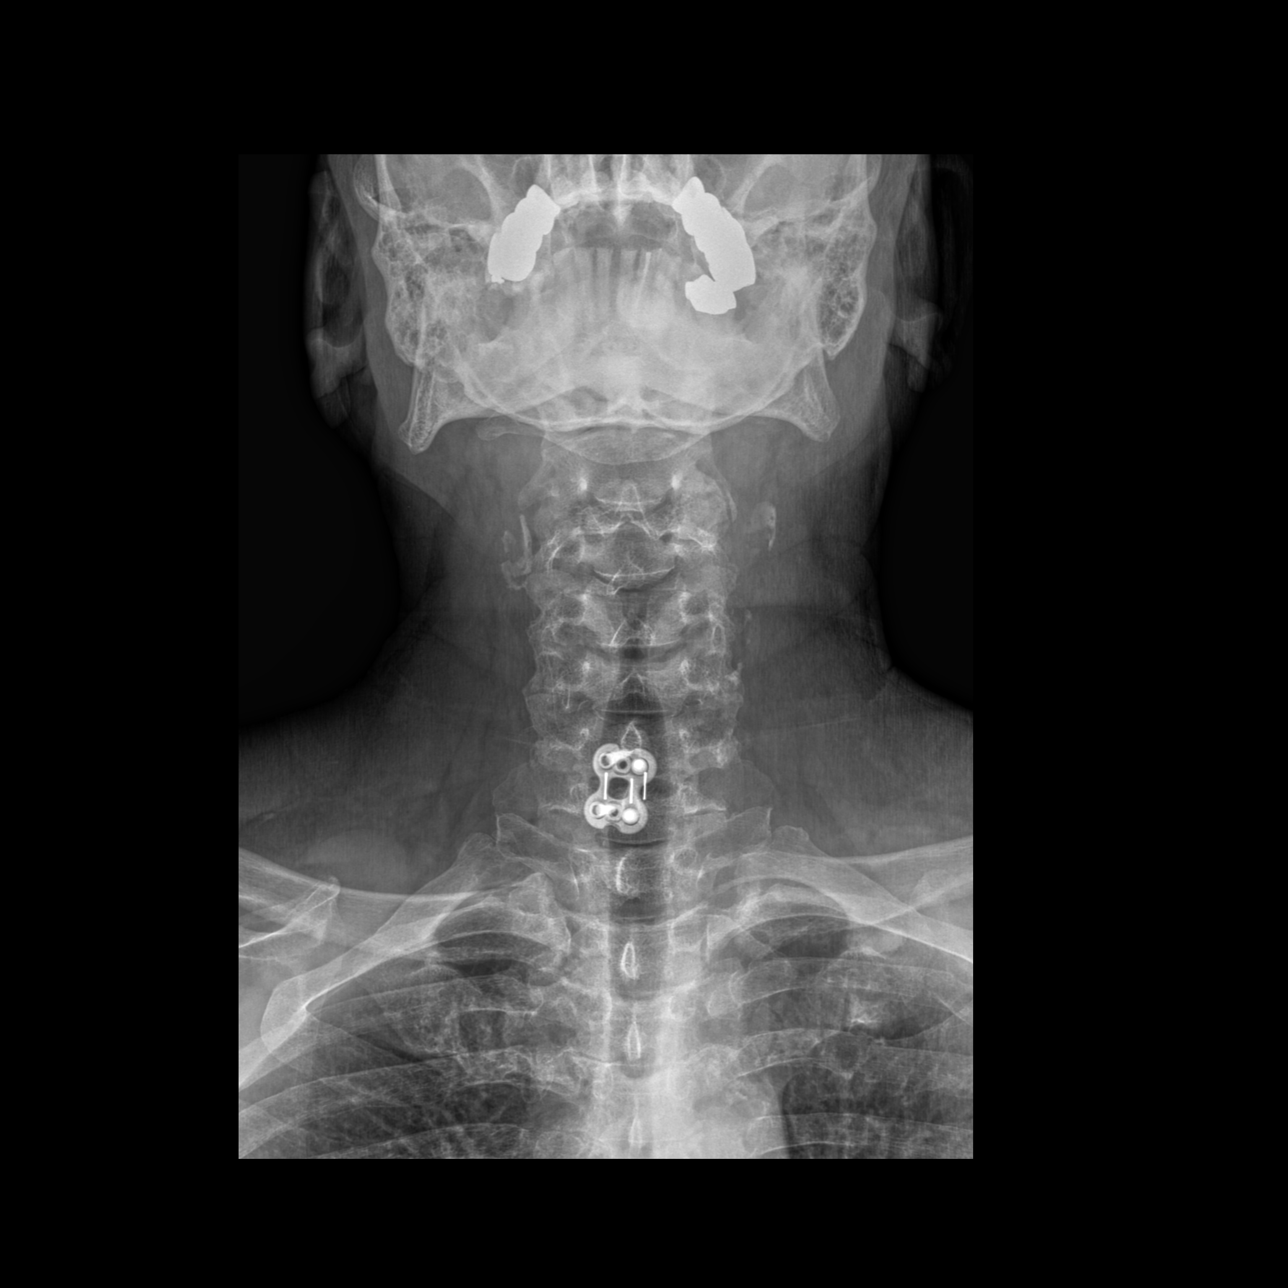

[3 of 3 positions shown; findings below may reference images not displayed]

FINDINGS: There is anterior fusion of C6 and C7. Difficult to visualize on the 
lateral view given the patients body habitus. No complication identified. No 
significant disc space narrowing seen elsewhere. Prevertebral soft tissues are 
within normal limits. Atherosclerotic changes seen involving the carotid bulbs 
bilaterally.
IMPRESSION: Postoperative changes without complication seen.
